# Patient Record
Sex: Female | Born: 1960 | Race: White | Hispanic: No | Marital: Married | Smoking: Never smoker
Health system: Southern US, Community
[De-identification: ages and names within clinical notes are randomized; demographics above are authoritative.]

## PROBLEM LIST (undated history)

## (undated) DIAGNOSIS — M797 Fibromyalgia: Secondary | ICD-10-CM

## (undated) DIAGNOSIS — H838X2 Other specified diseases of left inner ear: Secondary | ICD-10-CM

## (undated) DIAGNOSIS — R000 Tachycardia, unspecified: Secondary | ICD-10-CM

## (undated) DIAGNOSIS — D329 Benign neoplasm of meninges, unspecified: Secondary | ICD-10-CM

## (undated) DIAGNOSIS — F329 Major depressive disorder, single episode, unspecified: Secondary | ICD-10-CM

## (undated) DIAGNOSIS — F32A Depression, unspecified: Secondary | ICD-10-CM

## (undated) DIAGNOSIS — G43119 Migraine with aura, intractable, without status migrainosus: Secondary | ICD-10-CM

## (undated) HISTORY — DX: Benign neoplasm of meninges, unspecified: D32.9

## (undated) HISTORY — DX: Tachycardia, unspecified: R00.0

## (undated) HISTORY — DX: Other specified diseases of left inner ear: H83.8X2

## (undated) HISTORY — DX: Depression, unspecified: F32.A

## (undated) HISTORY — DX: Migraine with aura, intractable, without status migrainosus: G43.119

## (undated) HISTORY — DX: Major depressive disorder, single episode, unspecified: F32.9

## (undated) HISTORY — DX: Fibromyalgia: M79.7

---

## 2005-06-29 HISTORY — PX: ENDOMETRIAL ABLATION: SHX621

## 2008-05-29 HISTORY — PX: BRAIN MENINGIOMA EXCISION: SHX576

## 2010-09-10 ENCOUNTER — Observation Stay: Payer: Self-pay | Admitting: Internal Medicine

## 2010-12-08 ENCOUNTER — Emergency Department: Payer: Self-pay | Admitting: Emergency Medicine

## 2010-12-30 HISTORY — PX: OTHER SURGICAL HISTORY: SHX169

## 2011-01-05 ENCOUNTER — Ambulatory Visit: Payer: Self-pay | Admitting: Obstetrics and Gynecology

## 2011-08-10 ENCOUNTER — Emergency Department: Payer: Self-pay | Admitting: Unknown Physician Specialty

## 2012-02-01 ENCOUNTER — Ambulatory Visit: Payer: Self-pay

## 2012-03-01 ENCOUNTER — Ambulatory Visit: Payer: Self-pay | Admitting: Obstetrics and Gynecology

## 2013-04-25 ENCOUNTER — Ambulatory Visit: Payer: Self-pay | Admitting: Obstetrics and Gynecology

## 2013-05-02 ENCOUNTER — Ambulatory Visit: Payer: Self-pay | Admitting: Obstetrics and Gynecology

## 2013-09-20 ENCOUNTER — Ambulatory Visit: Payer: Self-pay | Admitting: Internal Medicine

## 2014-02-08 ENCOUNTER — Ambulatory Visit: Payer: Self-pay | Admitting: Internal Medicine

## 2014-06-25 ENCOUNTER — Ambulatory Visit (INDEPENDENT_AMBULATORY_CARE_PROVIDER_SITE_OTHER): Payer: BC Managed Care – PPO

## 2014-06-25 ENCOUNTER — Ambulatory Visit (INDEPENDENT_AMBULATORY_CARE_PROVIDER_SITE_OTHER): Payer: BC Managed Care – PPO | Admitting: Neurology

## 2014-06-25 ENCOUNTER — Encounter: Payer: Self-pay | Admitting: Neurology

## 2014-06-25 VITALS — BP 143/89 | HR 91 | Ht 60.0 in | Wt 158.0 lb

## 2014-06-25 DIAGNOSIS — M5412 Radiculopathy, cervical region: Secondary | ICD-10-CM

## 2014-06-25 DIAGNOSIS — R5383 Other fatigue: Secondary | ICD-10-CM

## 2014-06-25 DIAGNOSIS — R5381 Other malaise: Secondary | ICD-10-CM

## 2014-06-25 DIAGNOSIS — R531 Weakness: Secondary | ICD-10-CM

## 2014-06-25 NOTE — Progress Notes (Signed)
[facility]    Provider:  Dr Janann Colonel Referring Provider: Hayden Rasmussen, MD Primary Care Physician:  Hayden Rasmussen., MD  CC:  Left sided weakness  HPI:  Michelle Reilly is a 53 y.o. female here as a referral from Dr. Darron Doom for left sided weakness. She feels the weakness is mainly in the leg and can involve the arm too. If she gets very fatigued then it gets severe. By end of the week states she is so tired she can barely move that side. Notes some paresthesias and numbness in her LLE. Has occasional muscle twitching. Husband notes that she is affected by weather changes. Notes some gait instability. Notes some blurry vision. No noted ptosis.   Has history of fibromyalgia, diagnosed in 2009. Had meningioma resection done in 2009, was on the right side and causing "brain zaps, word finding difficulty" started then and has gotten progressively worse. Had MRI brain done in November 2014 ago which was unremarkable per the patient.   Review of Systems: Out of a complete 14 system review, the patient complains of only the following symptoms, and all other reviewed systems are negative. + fatigue, blurred vision, eye pain, headache, numbness, weakness, dizziness, sleepiness, tremor, too much sleep  History   Social History  . Marital Status: Married    Spouse Name: N/A    Number of Children: N/A  . Years of Education: N/A   Occupational History  . Not on file.   Social History Main Topics  . Smoking status: Never Smoker   . Smokeless tobacco: Not on file  . Alcohol Use: Yes  . Drug Use: No  . Sexual Activity: Yes   Other Topics Concern  . Not on file   Social History Narrative  . No narrative on file    No family history on file.  Past Medical History  Diagnosis Date  . Fibromyalgia     No past surgical history on file.  Current Outpatient Prescriptions  Medication Sig Dispense Refill  . acetaminophen (TYLENOL) 325 MG tablet Take 325 mg by mouth  as needed.      Marland Kitchen amitriptyline (ELAVIL) 10 MG tablet Take 10 mg by mouth.      . DULoxetine (CYMBALTA) 60 MG capsule Take 60 mg by mouth daily.      Marland Kitchen gabapentin (NEURONTIN) 100 MG capsule Take 100 mg by mouth.      . metoprolol succinate (TOPROL-XL) 25 MG 24 hr tablet Take 25 mg by mouth daily.      . Multiple Vitamin (MULTI-VITAMINS) TABS Take by mouth.      . traMADol (ULTRAM) 50 MG tablet Take 50 mg by mouth as needed.      . Vitamin D, Ergocalciferol, (DRISDOL) 50000 UNITS CAPS capsule Take 50,000 Units by mouth once a week.       No current facility-administered medications for this visit.    Allergies as of 06/25/2014 - Review Complete 06/25/2014  Allergen Reaction Noted  . Hydrocodone-acetaminophen Hives 06/25/2014  . Oxycodone-acetaminophen Itching 06/25/2014  . Penicillins Hives 06/25/2014  . Petrolatum-zinc oxide  06/25/2014  . Propofol Other (See Comments) 06/25/2014  . Doxycycline Rash 06/25/2014  . Erythromycin Rash 06/25/2014    Vitals: BP 143/89  Pulse 91  Ht 5' (1.524 m)  Wt 158 lb (71.668 kg)  BMI 30.86 kg/m2  LMP 06/11/2014 Last Weight:  Wt Readings from Last 1 Encounters:  06/25/14 158 lb (71.668 kg)   Last Height:   Ht Readings from Last  1 Encounters:  06/25/14 5' (1.524 m)     Physical exam: Exam: Gen: NAD, conversant Eyes: anicteric sclerae, moist conjunctivae HENT: Atraumatic, oropharynx clear Neck: Trachea midline; supple,  Lungs: CTA, no wheezing, rales, rhonic                          CV: RRR, no MRG Abdomen: Soft, non-tender;  Extremities: No peripheral edema  Skin: Normal temperature, no rash,  Psych: Appropriate affect, pleasant  Neuro: MS: AA&Ox3, appropriately interactive, normal affect   Attention: WORLD backwards  Speech: fluent w/o paraphasic error  Memory: good recent and remote recall  CN: PERRL, EOMI no nystagmus, no ptosis, sensation intact to LT V1-V3 bilat, face symmetric, no weakness, hearing grossly intact,  palate elevates symmetrically, shoulder shrug 5/5 bilat,  tongue protrudes midline, no fasiculations noted.  Motor: normal bulk and tone Strength: 5/5  In all extremities with giveaway weakness of the left upper and lower extremity  Coord: rapid alternating and point-to-point (FNF, HTS) movements intact.  Reflexes: brisk 3+ patellar bilaterally, 2+ achilles, 3+ right biceps, no clonus noted, bilat downgoing toes  Sens: LT intact in all extremities  Gait: posture, stance, stride and arm-swing normal. Tandem gait intact. Able to walk on heels and toes. Romberg absent.   Assessment:  After physical and neurologic examination, review of laboratory studies, imaging, neurophysiology testing and pre-existing records, assessment will be reviewed on the problem list.  Plan:  Treatment plan and additional workup will be reviewed under Problem List.  1)Weakness 2)Meningioma s/p resection 3)fibromyalgia  53y/o woman, s/p resection of right sided meningioma, presenting for initial evaluation of left sided weakness that fluctuates and is worse when patient is fatigued. Unilateral symptoms raises question of a central process, question if related to prior meningioma resection. Patient will fax prior MRI to our office for further review. MRI C spine reviewed and shows some degenerative changes but likely not enough to explain the presentation. Worsening of symptoms as the day goes on and with fatigue raises question of an autoimmune process, such as MG, but this would be a very atypical presentation. Diagnosis of fibromyalgia may also be playing a role. Patient noted to have very brisk bilateral patellar reflexes raising question of a thoracic/lumbar cord involvement. This could explain lower extremity weakness but would not explain LUE weakness. Will order EMG/NCS, will await review of previous records prior to ordering further testing. Will consider autoimmune workup and MRI T/L spine in the future.    Jim Like, DO  [facility] [address] [place], [place] [address]  Phone [phone] Fax [phone]

## 2014-06-25 NOTE — Procedures (Signed)
     HISTORY:  Michelle Reilly is a 53 year old patient with a prior meningioma resection from the right brain. The patient reports some fatigable weakness involving the left arm and left leg. She is being evaluated for possible neuromuscular transmission disorder.  NERVE CONDUCTION STUDIES:  A repetitive nerve stimulation study was performed today. The patient had stimulation of the left median nerve at the wrist with a pick up at the left APB muscle. The muscle was fatigued for 1 minute, and then stimulation of the median nerve was administered at 3 Hz with a train of ten stimulations. This was done at 15 seconds, 30 seconds, 1 minute, 2 minutes, 3 minutes, and 4 minutes following muscle fatigue. No evidence of a decremental response was seen. Baseline was stable.   IMPRESSION:  Repetitive nerve stimulation study involving the left median nerve was performed, and appeared to be within normal limits. No evidence of a neuromuscular transmission disorder was seen on this evaluation.  Jill Alexanders MD 06/25/2014 12:04 PM  [facility] [address] [place], [place] [address]  Phone [phone] Fax [phone]

## 2014-06-25 NOTE — Procedures (Signed)
     HISTORY:  Michelle Reilly is a 53 year old patient with a history of a meningioma resected from the right brain with left-sided weakness prior to resection. She reports some issues over the last 6 months with fatigable weakness of the left leg greater than the left arm. The patient is being evaluated for possible neuropathy or a myopathic disorder.  NERVE CONDUCTION STUDIES:  Nerve conduction studies were performed on the left upper extremity. The distal motor latencies and motor amplitudes for the median and ulnar nerves were within normal limits. The F wave latencies and nerve conduction velocities for these nerves were also normal. The sensory latencies for the median and ulnar nerves were normal.  Nerve conduction studies were performed on both lower extremities. The distal motor latencies and motor amplitudes for the peroneal and posterior tibial nerves were within normal limits. The nerve conduction velocities for these nerves were also normal. The H reflex latencies were normal. The sensory latencies for the peroneal nerves were within normal limits.   EMG STUDIES:  EMG study was performed on the left upper extremity:  The first dorsal interosseous muscle reveals 2 to 4 K units with full recruitment. No fibrillations or positive waves were noted. The abductor pollicis brevis muscle reveals 2 to 4 K units with full recruitment. No fibrillations or positive waves were noted. The extensor indicis proprius muscle reveals 1 to 3 K units with full recruitment. No fibrillations or positive waves were noted. The pronator teres muscle reveals 2 to 3 K units with full recruitment. No fibrillations or positive waves were noted. The biceps muscle reveals 1 to 2 K units with full recruitment. No fibrillations or positive waves were noted. The triceps muscle reveals 2 to 4 K units with full recruitment. No fibrillations or positive waves were noted. The anterior deltoid muscle reveals 2 to 3 K  units with full recruitment. No fibrillations or positive waves were noted. The cervical paraspinal muscles were tested at 2 levels. No abnormalities of insertional activity were seen at either level tested. There was good relaxation.  EMG study was performed on the left lower extremity:  The tibialis anterior muscle reveals 2 to 4K motor units with full recruitment. No fibrillations or positive waves were seen. The peroneus tertius muscle reveals 2 to 4K motor units with full recruitment. No fibrillations or positive waves were seen. The medial gastrocnemius muscle reveals 1 to 3K motor units with full recruitment. No fibrillations or positive waves were seen. The vastus lateralis muscle reveals 2 to 4K motor units with full recruitment. No fibrillations or positive waves were seen. The iliopsoas muscle reveals 2 to 4K motor units with full recruitment. No fibrillations or positive waves were seen. The biceps femoris muscle (long head) reveals 2 to 3K motor units with full recruitment. No fibrillations or positive waves were seen. The lumbosacral paraspinal muscles were tested at 3 levels, and revealed no abnormalities of insertional activity at all 3 levels tested. There was good relaxation.   IMPRESSION:  Nerve conduction studies done on the left upper extremity and on both lower extremities were unremarkable. No evidence of a peripheral neuropathy is seen. EMG evaluation of the left upper and left lower extremities were unremarkable, without evidence of a myopathic disorder or evidence of a cervical or a lumbosacral radiculopathy.  Jill Alexanders MD 06/25/2014 11:57 AM  [facility] [address] [place], [place] [address]  Phone [phone] Fax [phone]

## 2014-06-25 NOTE — Patient Instructions (Signed)
Overall you are doing fairly well but I do want to suggest a few things today:   Remember to drink plenty of fluid, eat healthy meals and do not skip any meals. Try to eat protein with a every meal and eat a healthy snack such as fruit or nuts in between meals. Try to keep a regular sleep-wake schedule and try to exercise daily, particularly in the form of walking, 20-30 minutes a day, if you can.   As far as diagnostic testing:  1)Please schedule an EMG/Nerve conduction study when you check out  Please have your prior records from [facility] and [facility] sent to our office for further review.  We will follow up once I get your records for review. Please call us with any interim questions, concerns, problems, updates or refill requests.   My clinical assistant and will answer any of your questions and relay your messages to me and also relay most of my messages to you.   Our phone number is [phone]. We also have an after hours call service for urgent matters and there is a physician on-call for urgent questions. For any emergencies you know to call 911 or go to the nearest emergency room

## 2014-06-27 ENCOUNTER — Encounter: Payer: BC Managed Care – PPO | Admitting: Neurology

## 2016-07-23 ENCOUNTER — Encounter: Payer: Self-pay | Admitting: Neurology

## 2016-07-23 ENCOUNTER — Ambulatory Visit (INDEPENDENT_AMBULATORY_CARE_PROVIDER_SITE_OTHER): Payer: Managed Care, Other (non HMO) | Admitting: Neurology

## 2016-07-23 ENCOUNTER — Telehealth: Payer: Self-pay

## 2016-07-23 DIAGNOSIS — G43119 Migraine with aura, intractable, without status migrainosus: Secondary | ICD-10-CM | POA: Diagnosis not present

## 2016-07-23 HISTORY — DX: Migraine with aura, intractable, without status migrainosus: G43.119

## 2016-07-23 MED ORDER — KETOROLAC TROMETHAMINE 10 MG PO TABS: 10.0000 mg | ORAL_TABLET | Freq: Four times a day (QID) | ORAL | 2 refills | Status: DC | PRN

## 2016-07-23 MED ORDER — TOPIRAMATE 25 MG PO TABS: ORAL_TABLET | ORAL | 3 refills | Status: DC

## 2016-07-23 MED ORDER — ONDANSETRON HCL 4 MG PO TABS: 4.0000 mg | ORAL_TABLET | Freq: Three times a day (TID) | ORAL | 2 refills | Status: DC | PRN

## 2016-07-23 NOTE — Progress Notes (Signed)
Reason for visit: Migraine headache  Referring physician: Dr. Prudencio Burly is a 55 y.o. female  History of present illness:  Ms. Midgette is a 55 year old right-handed white female with a history of classic migraine headache. The patient also has a history of a meningioma that was discovered in 2009. The patient indicates that her migraine headaches became more prominent around that time. The patient had surgical resection of the right parietal tumor, but she had recurrence of the tumor approximately a year ago. This was treated with gamma knife radiation. 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 The patient has had some worsening of her headaches since 2016. Within the last 2 months, the headaches have become more frequent. The patient has had only 5 or 6 days without any headache within the last month. The headaches are on the top of the head, and spread to the right temporal area and occipital area associated with photophobia, phonophobia, nausea and vomiting. The patient has visual disturbances with the headache with a white halo around the peripheral vision bilaterally and with significant blurring of vision. The patient may also have some numbness of the left arm and leg and some weakness of the left arm and leg with the headache. The headaches may last up to 36 hours. The patient is taking Maxalt for the headache without much benefit. She also takes Tylenol and Ultram. The patient has come out of work since 06/24/2016. She is on short-term disability currently. The patient indicates that bright lights, certain odors such as peppers and weather changes will bring on headache. The patient is followed by Dr. Salomon Fick from [facility] for her meningioma. She indicates that she did have a recent brain MRI done on 07/06/2016 at [facility], she was told that this study did not show recurrence of the meningioma or any acute changes. She is sent to this  office for an evaluation. The patient is on Cymbalta, the dose was increased in 2016, but the patient does not believe that her migraines worsened around this time. The patient has a prominent family history of migraine, her mother and 2 of her brothers have headache. The patient describes her headache pain is a sharp pain with a throbbing sensation as well.  Past Medical History:  Diagnosis Date  . Depression   . Fibromyalgia   . Meningioma ([place])   . 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 Tachycardia    superior Ventriculat    Past Surgical History:  Procedure Laterality Date  . BRAIN MENINGIOMA EXCISION  05/2008   Gamma knife procedure 2016  . ENDOMETRIAL ABLATION  8/06  . heart ablation  2/12    Family History  Problem Relation Age of Onset  . Diabetes Mother   . Arthritis/Rheumatoid Mother   . Addison's disease Mother   . Anemia Mother   . Metabolic syndrome Brother     Social history:  reports that she has never smoked. She does not have any smokeless tobacco history on file. She reports that she drinks alcohol. She reports that she does not use drugs.  Medications:  Prior to Admission medications   Medication Sig Start Date End Date Taking? Authorizing Provider  acetaminophen (TYLENOL) 325 MG tablet Take 325 mg by mouth as needed.    Historical Provider, MD  DULoxetine (CYMBALTA) 60 MG capsule Take 60 mg by mouth daily. 05/06/14   Historical Provider, MD  gabapentin (NEURONTIN) 100 MG  capsule Take 100 mg by mouth. 10/18/10   Historical Provider, MD  metoprolol succinate (TOPROL-XL) 25 MG 24 hr tablet Take 25 mg by mouth daily. 06/04/14   Historical Provider, MD  Multiple Vitamin (MULTI-VITAMINS) TABS Take by mouth.    Historical Provider, MD  traMADol (ULTRAM) 50 MG tablet Take 50 mg by mouth as needed. 10/07/10   Historical Provider, MD  Vitamin D, Ergocalciferol, (DRISDOL) 50000 UNITS CAPS capsule Take 50,000 Units by mouth once a week. 05/06/14   Historical  Provider, MD      Allergies  Allergen Reactions  . Other Other (See Comments)    Other reaction(s): Other Propafol (sedating agent) - causes severe congestion  . Hydrocodone-Acetaminophen Hives  . Oxycodone-Acetaminophen Itching    Other Reaction: itchy face  . Penicillins Hives  . Petrolatum-Zinc Oxide     Other reaction(s): Vomiting  . Propofol Other (See Comments)    After heart ablation, they told her she was allergic, all she remembers is congestion.  . Doxycycline Rash  . Erythromycin Rash  . Nitrofurantoin Nausea And Vomiting  . Sulfa Antibiotics Nausea And Vomiting    ROS:  Out of a complete 14 system review of symptoms, the patient complains only of the following symptoms, and all other reviewed systems are negative.  Decreased activity, decreased appetite, fatigue Light sensitivity, blurred vision Constipation Frequent infections Achy muscles, walking difficulty Dizziness, headache, weakness, tremors  Blood pressure 117/81, pulse 98, height 5\' 1"  (1.549 m), weight 139 lb 8 oz (63.3 kg), last menstrual period 06/11/2014.  Physical Exam  General: The patient is alert and cooperative at the time of the examination.  Eyes: Pupils are equal, round, and reactive to light. Discs are flat bilaterally.  Neck: The neck is supple, no carotid bruits are noted.  Respiratory: The respiratory examination is clear.  Cardiovascular: The cardiovascular examination reveals a regular rate and rhythm, no obvious murmurs or rubs are noted.  Neuromuscular: Range of movement of the cervical spine is full. No crepitus is noted in the temporal manubrial joints.  Skin: Extremities are without significant edema.  Neurologic Exam  Mental status: The patient is alert and oriented x 3 at the time of the examination. The patient has apparent normal recent and remote memory, with an apparently normal attention span and concentration ability.  Cranial nerves: Facial symmetry is  present. There is good sensation of the face to pinprick and soft touch bilaterally. The strength of the facial muscles and the muscles to head turning and shoulder shrug are normal bilaterally. Speech is well enunciated, no aphasia or dysarthria is noted. Extraocular movements are full. Visual fields are full. The tongue is midline, and the patient has symmetric elevation of the soft palate. No obvious hearing deficits are noted.  Motor: The motor testing reveals 5 over 5 strength of all 4 extremities. Good symmetric motor tone is noted throughout.  Sensory: Sensory testing is intact to pinprick, soft touch, vibration sensation, and position sense on all 4 extremities. No evidence of extinction is noted.  Coordination: Cerebellar testing reveals good finger-nose-finger and heel-to-shin bilaterally.  Gait and station: Gait is normal. Tandem gait is normal. Romberg is negative. No drift is seen.  Reflexes: Deep tendon reflexes are symmetric and normal bilaterally. Toes are downgoing bilaterally.   MRI brain 09/20/13:  IMPRESSION:   1. No recurrent or residual malignancy.  2. No acute intracranial pathology.    Assessment/Plan:  1. Classic migraine headache  2. History of right parietal meningioma  The  patient is on Cymbalta which may exacerbate migraine headaches. The patient is not clear that there is an association with this medication and her migraine. The patient takes his medication for fibromyalgia, she is on gabapentin for this same reason. She is also on verapamil. The patient will be placed on Topamax and she will be taken off of Maxalt given the stroke like symptoms with her headache. The patient will be given Toradol to take if needed, and Zofran to take for the nausea. She will follow-up in 2 months. The patient is out of work currently, she may return to work once her headaches come under better control.  Jill Alexanders MD 07/23/2016 8:28 AM  [facility] [address] [place], [place] [address]  Phone [phone] Fax [phone]

## 2016-07-23 NOTE — Telephone Encounter (Signed)
FMLA paperwork completed today. Faxed to [employer] per pt request. Copies mailed to pt and to medical records for scanning into chart.

## 2016-07-23 NOTE — Patient Instructions (Addendum)
Topamax (topiramate) is a seizure medication that has an FDA approval for seizures and for migraine headache. Potential side effects of this medication include weight loss, cognitive slowing, tingling in the fingers and toes, and carbonated drinks will taste bad. If any significant side effects are noted on this drug, please contact our office.  Migraine Headache A migraine headache is an intense, throbbing pain on one or both sides of your head. A migraine can last for 30 minutes to several hours. CAUSES  The exact cause of a migraine headache is not always known. However, a migraine may be caused when nerves in the brain become irritated and release chemicals that cause inflammation. This causes pain. Certain things may also trigger migraines, such as:  Alcohol.  Smoking.  Stress.  Menstruation.  Aged cheeses.  Foods or drinks that contain nitrates, glutamate, aspartame, or tyramine.  Lack of sleep.  Chocolate.  Caffeine.  Hunger.  Physical exertion.  Fatigue.  Medicines used to treat chest pain (nitroglycerine), birth control pills, estrogen, and some blood pressure medicines. SIGNS AND SYMPTOMS  Pain on one or both sides of your head.  Pulsating or throbbing pain.  Severe pain that prevents daily activities.  Pain that is aggravated by any physical activity.  Nausea, vomiting, or both.  Dizziness.  Pain with exposure to bright lights, loud noises, or activity.  General sensitivity to bright lights, loud noises, or smells. Before you get a migraine, you may get warning signs that a migraine is coming (aura). An aura may include:  Seeing flashing lights.  Seeing bright spots, halos, or zigzag lines.  Having tunnel vision or blurred vision.  Having feelings of numbness or tingling.  Having trouble talking.  Having muscle weakness. DIAGNOSIS  A migraine headache is often diagnosed based on:  Symptoms.  Physical exam.  A CT scan or MRI of your  head. These imaging tests cannot diagnose migraines, but they can help rule out other causes of headaches. TREATMENT Medicines may be given for pain and nausea. Medicines can also be given to help prevent recurrent migraines.  HOME CARE INSTRUCTIONS  Only take over-the-counter or prescription medicines for pain or discomfort as directed by your health care provider. The use of long-term narcotics is not recommended.  Lie down in a dark, quiet room when you have a migraine.  Keep a journal to find out what may trigger your migraine headaches. For example, write down:  What you eat and drink.  How much sleep you get.  Any change to your diet or medicines.  Limit alcohol consumption.  Quit smoking if you smoke.  Get 7-9 hours of sleep, or as recommended by your health care provider.  Limit stress.  Keep lights dim if bright lights bother you and make your migraines worse. SEEK IMMEDIATE MEDICAL CARE IF:   Your migraine becomes severe.  You have a fever.  You have a stiff neck.  You have vision loss.  You have muscular weakness or loss of muscle control.  You start losing your balance or have trouble walking.  You feel faint or pass out.  You have severe symptoms that are different from your first symptoms. MAKE SURE YOU:   Understand these instructions.  Will watch your condition.  Will get help right away if you are not doing well or get worse.   This information is not intended to replace advice given to you by your health care provider. Make sure you discuss any questions you have with your  health care provider.   Document Released: 11/15/2005 Document Revised: 12/06/2014 Document Reviewed: 07/23/2013 Elsevier Interactive Patient Education [employer].

## 2016-07-27 ENCOUNTER — Telehealth: Payer: Self-pay | Admitting: *Deleted

## 2016-07-27 DIAGNOSIS — Z0289 Encounter for other administrative examinations: Secondary | ICD-10-CM

## 2016-07-27 NOTE — Telephone Encounter (Signed)
Pt [employer] form on [employer].

## 2016-07-29 ENCOUNTER — Telehealth: Payer: Self-pay | Admitting: Neurology

## 2016-07-29 MED ORDER — ZONISAMIDE 25 MG PO CAPS: ORAL_CAPSULE | ORAL | 3 refills | Status: DC

## 2016-07-29 NOTE — Telephone Encounter (Signed)
Pt was seen last Friday d/t migraine. She also has hx of meningioma and fibromyalgia. She's on Cymbalta, gabapentin and verapamil. Was started on Topamax and taken off of Maxalt. Also given Toradol and Zofran to use as needed. She called this afternoon saying that she feels worse since meds were changed.

## 2016-07-29 NOTE — Telephone Encounter (Signed)
I called the patient. She feels poorly on the Topamax. We will stop the medication and try zonegran.

## 2016-07-29 NOTE — Telephone Encounter (Signed)
Pt called said somedays she feels like she has the flu, feels worse that when she started the medications. She doesn't know which one or all is causing the symptoms. Please call

## 2016-07-30 ENCOUNTER — Telehealth: Payer: Self-pay | Admitting: *Deleted

## 2016-07-30 NOTE — Telephone Encounter (Signed)
Pt form faxed to [facility] on 07/30/2016.

## 2016-08-16 ENCOUNTER — Telehealth: Payer: Self-pay | Admitting: Neurology

## 2016-08-16 NOTE — Telephone Encounter (Signed)
Returned call to pt. Reports that she was able to manage HA earlier today w/ Tylenol but did have to take Toradol this afternoon as pain intensified. She just increase dose of Zonegran to 75 mg on Friday. Let her know that this medication is a HA preventative and since she has only been on higher dose of med for a couple of days, it may take a week or longer for medication to help decrease the frequency of the HAS. Meanwhile, she can use prn pain meds for breakthrough HA. Agreed to call back if she is unable to manage pain this way or if she notices more frequent HAs over the next couple of weeks.

## 2016-08-16 NOTE — Telephone Encounter (Signed)
Pt called in stating she had a migraine on Friday and a wicked one on Saturday. She currently has a headache and is taking Tylenol to keep it from getting worse. Stats she is taking donisamide 75 mg. She has been at 75mg  since Friday. She wants to know what do. She is tired of feeling this way. Please call and advise [phone]

## 2016-08-31 ENCOUNTER — Emergency Department (HOSPITAL_COMMUNITY): Payer: Managed Care, Other (non HMO)

## 2016-08-31 ENCOUNTER — Encounter (HOSPITAL_COMMUNITY): Payer: Self-pay | Admitting: Emergency Medicine

## 2016-08-31 ENCOUNTER — Emergency Department (HOSPITAL_COMMUNITY)
Admission: EM | Admit: 2016-08-31 | Discharge: 2016-08-31 | Disposition: A | Discharge status: Home / Self Care | Payer: Managed Care, Other (non HMO) | Attending: Emergency Medicine | Admitting: Emergency Medicine

## 2016-08-31 DIAGNOSIS — K5900 Constipation, unspecified: Secondary | ICD-10-CM

## 2016-08-31 DIAGNOSIS — R1012 Left upper quadrant pain: Secondary | ICD-10-CM | POA: Insufficient documentation

## 2016-08-31 DIAGNOSIS — Z791 Long term (current) use of non-steroidal anti-inflammatories (NSAID): Secondary | ICD-10-CM | POA: Insufficient documentation

## 2016-08-31 DIAGNOSIS — R51 Headache: Secondary | ICD-10-CM | POA: Insufficient documentation

## 2016-08-31 DIAGNOSIS — R109 Unspecified abdominal pain: Secondary | ICD-10-CM | POA: Diagnosis present

## 2016-08-31 DIAGNOSIS — R1110 Vomiting, unspecified: Secondary | ICD-10-CM | POA: Insufficient documentation

## 2016-08-31 DIAGNOSIS — Z79899 Other long term (current) drug therapy: Secondary | ICD-10-CM | POA: Insufficient documentation

## 2016-08-31 LAB — COMPREHENSIVE METABOLIC PANEL
ALBUMIN: 4 g/dL (ref 3.5–5.0)
ALK PHOS: 90 U/L (ref 38–126)
ALT: 16 U/L (ref 14–54)
AST: 17 U/L (ref 15–41)
Anion gap: 5 (ref 5–15)
BUN: 11 mg/dL (ref 6–20)
CALCIUM: 9.4 mg/dL (ref 8.9–10.3)
CO2: 29 mmol/L (ref 22–32)
CREATININE: 0.95 mg/dL (ref 0.44–1.00)
Chloride: 107 mmol/L (ref 101–111)
GFR calc Af Amer: >60 mL/min (ref >60)
GFR calc non Af Amer: >60 mL/min (ref >60)
GLUCOSE: 88 mg/dL (ref 65–99)
Potassium: 4.1 mmol/L (ref 3.5–5.1)
SODIUM: 141 mmol/L (ref 135–145)
Total Bilirubin: 0.8 mg/dL (ref 0.3–1.2)
Total Protein: 6.9 g/dL (ref 6.5–8.1)

## 2016-08-31 LAB — LIPASE, BLOOD: Lipase: 38 U/L (ref 11–51)

## 2016-08-31 LAB — CBC
HCT: 40.3 % (ref 36.0–46.0)
Hemoglobin: 13.5 g/dL (ref 12.0–15.0)
MCH: 31.1 pg (ref 26.0–34.0)
MCHC: 33.5 g/dL (ref 30.0–36.0)
MCV: 92.9 fL (ref 78.0–100.0)
PLATELETS: 287 10*3/uL (ref 150–400)
RBC: 4.34 MIL/uL (ref 3.87–5.11)
RDW: 12.4 % (ref 11.5–15.5)
WBC: 5.6 10*3/uL (ref 4.0–10.5)

## 2016-08-31 LAB — URINALYSIS, ROUTINE W REFLEX MICROSCOPIC
BILIRUBIN URINE: NEGATIVE
Glucose, UA: NEGATIVE mg/dL
HGB URINE DIPSTICK: NEGATIVE
KETONES UR: NEGATIVE mg/dL
Leukocytes, UA: NEGATIVE
Nitrite: NEGATIVE
PROTEIN: NEGATIVE mg/dL
SPECIFIC GRAVITY, URINE: 1.015 (ref 1.005–1.030)
pH: 6.5 (ref 5.0–8.0)

## 2016-08-31 MED ORDER — SODIUM CHLORIDE 0.9 % IV BOLUS (SEPSIS)
500.0000 mL | Freq: Once | INTRAVENOUS | Status: AC
  Administered 2016-08-31: 500 mL via INTRAVENOUS

## 2016-08-31 MED ORDER — FENTANYL CITRATE (PF) 100 MCG/2ML IJ SOLN
50.0000 ug | INTRAMUSCULAR | Status: DC | PRN
  Administered 2016-08-31: 50 ug via INTRAVENOUS
  Filled 2016-08-31: qty 2

## 2016-08-31 MED ORDER — DICYCLOMINE HCL 20 MG PO TABS: 20.0000 mg | ORAL_TABLET | Freq: Three times a day (TID) | ORAL | 0 refills | Status: DC | PRN

## 2016-08-31 MED ORDER — ONDANSETRON HCL 4 MG/2ML IJ SOLN
4.0000 mg | Freq: Once | INTRAMUSCULAR | Status: AC | PRN
  Administered 2016-08-31: 4 mg via INTRAVENOUS
  Filled 2016-08-31: qty 2

## 2016-08-31 MED ORDER — ONDANSETRON 4 MG PO TBDP
4.0000 mg | ORAL_TABLET | Freq: Once | ORAL | Status: AC | PRN
  Administered 2016-08-31: 4 mg via ORAL
  Filled 2016-08-31: qty 1

## 2016-08-31 MED ORDER — HYDROMORPHONE HCL 1 MG/ML IJ SOLN
1.0000 mg | Freq: Once | INTRAMUSCULAR | Status: AC
  Administered 2016-08-31: 1 mg via INTRAVENOUS
  Filled 2016-08-31: qty 1

## 2016-08-31 MED ORDER — ONDANSETRON HCL 4 MG/2ML IJ SOLN
4.0000 mg | Freq: Once | INTRAMUSCULAR | Status: DC
  Filled 2016-08-31: qty 2

## 2016-08-31 MED ORDER — MAGNESIUM CITRATE PO SOLN: 0.5000 | Freq: Once | ORAL | 0 refills | Status: DC | PRN

## 2016-08-31 MED ORDER — ONDANSETRON HCL 4 MG/2ML IJ SOLN: 4.0000 mg | Freq: Once | INTRAMUSCULAR | Status: DC | PRN

## 2016-08-31 MED ORDER — IOPAMIDOL (ISOVUE-300) INJECTION 61%
100.0000 mL | Freq: Once | INTRAVENOUS | Status: AC | PRN
  Administered 2016-08-31: 100 mL via INTRAVENOUS

## 2016-08-31 MED ORDER — IOPAMIDOL (ISOVUE-300) INJECTION 61%
30.0000 mL | Freq: Once | INTRAVENOUS | Status: DC | PRN
  Administered 2016-08-31: 30 mL via ORAL
  Filled 2016-08-31: qty 30

## 2016-08-31 NOTE — ED Triage Notes (Signed)
Pt states that she was seen earlier and had a CT scan of her abdomen that only showed constipation. Tried laxatives at home but was she has been vomiting since. Migraine. Alert and oriented.

## 2016-08-31 NOTE — ED Provider Notes (Signed)
[place] DEPT Provider Note   CSN: [id number] Arrival date & time: 08/31/16  [id number]     History   Chief Complaint Chief Complaint  Patient presents with  . Flank Pain  . Abdominal Pain    HPI Michelle Reilly is a 55 y.o. female.  HPI   Patient with hx diverticulitis, colon polyps presents with left sided abdominal pain, left lower back pain, constipation.  Constipation has been ongoing x weeks, no significant relief with bisocdyl, miralax, Linzest.  Associated nausea without vomiting.  She is able to pass gas.  No urinary symptoms.  Has had chills but Tmax of 98.4.  Last colonoscopy was 4.5 years ago, she is supposed to get them Q3 yrs.  No local GI, referral in process.  PCP Precious Haws.   Past Medical History:  Diagnosis Date  . Classical migraine with intractable migraine 07/23/2016  . Depression   . Fibromyalgia   . Meningioma ([place])   . 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 Tachycardia    superior Ventriculat    Patient Active Problem List   Diagnosis Date Noted  . Classical migraine with intractable migraine 07/23/2016  . Weakness 06/25/2014    Past Surgical History:  Procedure Laterality Date  . BRAIN MENINGIOMA EXCISION  05/2008   Gamma knife procedure 2016  . ENDOMETRIAL ABLATION  8/06  . heart ablation  2/12    OB History    No data available       Home Medications    Prior to Admission medications   Medication Sig Start Date End Date Taking? Authorizing Provider  acetaminophen (TYLENOL) 500 MG tablet Take 1,000 mg by mouth every 6 (six) hours as needed for mild pain or headache.    Yes Historical Provider, MD  buPROPion (WELLBUTRIN XL) 150 MG 24 hr tablet one tablet daily in the morning 05/11/15  Yes Historical Provider, MD  DULoxetine (CYMBALTA) 60 MG capsule Take 120 mg by mouth at bedtime.  05/06/14  Yes Historical Provider, MD  gabapentin (NEURONTIN) 300 MG capsule Take 300 mg by mouth at bedtime.  10/18/10  Yes Historical  Provider, MD  ketorolac (TORADOL) 10 MG tablet Take 1 tablet (10 mg total) by mouth every 6 (six) hours as needed. Patient taking differently: Take 10 mg by mouth every 6 (six) hours as needed for moderate pain.  07/23/16  Yes Kathrynn Ducking, MD  linaclotide [facility]) 72 MCG capsule Take 144 mcg by mouth daily before breakfast.   Yes Historical Provider, MD  ondansetron (ZOFRAN) 4 MG tablet Take 1 tablet (4 mg total) by mouth every 8 (eight) hours as needed for nausea or vomiting. 07/23/16  Yes Kathrynn Ducking, MD  traMADol (ULTRAM) 50 MG tablet Take 100 mg by mouth as needed.  10/07/10  Yes Historical Provider, MD  verapamil (VERELAN) 100 MG 24 hr capsule Take 200 mg by mouth at bedtime.  02/27/16 02/21/17 Yes Historical Provider, MD  Vitamin D, Ergocalciferol, (DRISDOL) 50000 UNITS CAPS capsule Take 50,000 Units by mouth once a week. 05/06/14  Yes Historical Provider, MD  zonisamide (ZONEGRAN) 25 MG capsule Take one capsule at night for one week, then take 2 capsules at night for one week, then take 3 capsules at night Patient taking differently: Take 75 mg by mouth at bedtime.  07/29/16  Yes Kathrynn Ducking, MD  dicyclomine (BENTYL) 20 MG tablet Take 1 tablet (20 mg total) by mouth 3 (three) times daily as needed for spasms (abdominal  cramping/pain). 08/31/16   Clayton Bibles, PA-C  magnesium citrate SOLN Take 148 mLs (0.5 Bottles total) by mouth once as needed for severe constipation. 08/31/16   Clayton Bibles, PA-C    Family History Family History  Problem Relation Age of Onset  . Diabetes Mother   . Arthritis/Rheumatoid Mother   . Addison's disease Mother   . Anemia Mother   . Migraines Mother   . Cancer - Lung Father   . Metabolic syndrome Brother   . Migraines Brother   . Migraines Brother     Social History Social History  Substance Use Topics  . Smoking status: Never Smoker  . Smokeless tobacco: Never Used  . Alcohol use Yes     Allergies   Other; Hydrocodone-acetaminophen;  Oxycodone-acetaminophen; Penicillins; Petrolatum-zinc oxide; Propofol; Topamax [topiramate]; Doxycycline; Erythromycin; Nitrofurantoin; and Sulfa antibiotics   Review of Systems Review of Systems  All other systems reviewed and are negative.    Physical Exam Updated Vital Signs BP 121/75 (BP Location: Right Arm)   Pulse 74   Temp 97.8 F (36.6 C)   Resp 20   LMP 06/11/2014   SpO2 100%   Physical Exam  Constitutional: She appears well-developed and well-nourished. No distress.  HENT:  Head: Normocephalic and atraumatic.  Neck: Neck supple.  Cardiovascular: Normal rate and regular rhythm.   Pulmonary/Chest: Effort normal and breath sounds normal. No respiratory distress. She has no wheezes. She has no rales.  Abdominal: Soft. Bowel sounds are normal. She exhibits no distension. There is tenderness in the left upper quadrant and left lower quadrant. There is CVA tenderness (left). There is no rebound and no guarding.  Genitourinary: Rectum normal. Rectal exam shows no mass, no tenderness and anal tone normal.  Genitourinary Comments: No stool impaction.   Neurological: She is alert.  Skin: She is not diaphoretic.  Nursing note and vitals reviewed.    ED Treatments / Results  Labs (all labs ordered are listed, but only abnormal results are displayed) Labs Reviewed  LIPASE, BLOOD  COMPREHENSIVE METABOLIC PANEL  CBC  URINALYSIS, ROUTINE W REFLEX MICROSCOPIC (NOT AT [facility])    EKG  EKG Interpretation None       Radiology Ct Abdomen Pelvis W Contrast  Result Date: 08/31/2016 CLINICAL DATA:  Constipation and progressive left flank pain. EXAM: CT ABDOMEN AND PELVIS WITH CONTRAST TECHNIQUE: Multidetector CT imaging of the abdomen and pelvis was performed using the standard protocol following bolus administration of intravenous contrast. CONTRAST:  164mL ISOVUE-300 IOPAMIDOL (ISOVUE-300) INJECTION 61% COMPARISON:  None. FINDINGS: Lower chest: Unremarkable Hepatobiliary:  Unremarkable Pancreas: Unremarkable Spleen: Unremarkable Adrenals/Urinary Tract: 2-3 mm right kidney lower pole nonobstructive calculus. 2 mm left mid kidney nonobstructive calculus. No hydronephrosis or hydroureter. No ureteral or bladder calculus identified. Stomach/Bowel: Prominent stool throughout the colon favors constipation. Appendix normal. No dilated small bowel. The prominence of stool affects primarily the proximal 75% of the colon. Vascular/Lymphatic: Unremarkable Reproductive: Suspected small anterior uterine body intramural fibroid. Other: No supplemental non-categorized findings. Musculoskeletal: Lumbar spondylosis and degenerative disc disease at L5-S1 without impingement. Small umbilical hernia contains adipose tissue. IMPRESSION: 1.  Prominent stool throughout the colon favors constipation. 2. Bilateral nonobstructive nephrolithiasis. 3. Small anterior uterine body fibroid. 4. Lumbar spondylosis and degenerative disc disease at L5-S1, without impingement. Electronically Signed   By: Van Clines M.D.   On: 08/31/2016 09:39    Procedures Procedures (including critical care time)  Medications Ordered in ED Medications  ondansetron (ZOFRAN) injection 4 mg (4 mg Intravenous Not  Given 08/31/16 0849)  iopamidol (ISOVUE-300) 61 % injection 30 mL (30 mLs Oral Contrast Given 08/31/16 0826)  ondansetron (ZOFRAN) injection 4 mg (4 mg Intravenous Given 08/31/16 0753)  sodium chloride 0.9 % bolus 500 mL (0 mLs Intravenous Stopped 08/31/16 0948)  HYDROmorphone (DILAUDID) injection 1 mg (1 mg Intravenous Given 08/31/16 0845)  iopamidol (ISOVUE-300) 61 % injection 100 mL (100 mLs Intravenous Contrast Given 08/31/16 0922)     Initial Impression / Assessment and Plan / ED Course  I have reviewed the triage vital signs and the nursing notes.  Pertinent labs & imaging results that were available during my care of the patient were reviewed by me and considered in my medical decision making (see  chart for details).  Clinical Course    Afebrile nontoxic patient with severe constipation and left sided abdominal pain.  Labs reassuring.  UA does not appear infected.  CT abd/pelvis demonstrates constipation, incidental finding of small uterine fibroid.  D/C home with symptomatic treatment for constipation and abdominal cramping.  Pt will have GI follow up soon, referral in process through PCP.  Discussed result, findings, treatment, and follow up  with patient.  Pt given return precautions.  Pt verbalizes understanding and agrees with plan.      Final Clinical Impressions(s) / ED Diagnoses   Final diagnoses:  Constipation, unspecified constipation type  Abdominal pain, unspecified abdominal location    New Prescriptions Discharge Medication List as of 08/31/2016 11:50 AM    START taking these medications   Details  dicyclomine (BENTYL) 20 MG tablet Take 1 tablet (20 mg total) by mouth 3 (three) times daily as needed for spasms (abdominal cramping/pain)., Starting Tue 08/31/2016, Print    magnesium citrate SOLN Take 148 mLs (0.5 Bottles total) by mouth once as needed for severe constipation., Starting Tue 08/31/2016, Print         [place], PA-C 08/31/16 [place], MD 08/31/16 [phone]

## 2016-08-31 NOTE — ED Triage Notes (Signed)
Pt c/o ongoing constipation x several weeks and progressive flank pain onset Friday, started Miralax last week, no relief, started Linzest on Friday, Linzest with suppository on Saturday. Associated symptoms: nausea. No urinary symptoms.

## 2016-08-31 NOTE — Discharge Instructions (Signed)
Read the information below.  Use the prescribed medication as directed.  Please discuss all new medications with your pharmacist.  You may return to the Emergency Department at any time for worsening condition or any new symptoms that concern you.   If you develop high fevers, worsening abdominal pain, uncontrolled vomiting, or are unable to tolerate fluids by mouth, return to the ER for a recheck.  ° °

## 2016-09-01 ENCOUNTER — Emergency Department (HOSPITAL_COMMUNITY)
Admission: EM | Admit: 2016-09-01 | Discharge: 2016-09-01 | Disposition: A | Discharge status: Home / Self Care | Payer: Managed Care, Other (non HMO) | Source: Home / Self Care | Attending: Emergency Medicine | Admitting: Emergency Medicine

## 2016-09-01 DIAGNOSIS — K5900 Constipation, unspecified: Secondary | ICD-10-CM

## 2016-09-01 MED ORDER — ONDANSETRON 4 MG PO TBDP
4.0000 mg | ORAL_TABLET | Freq: Once | ORAL | Status: AC
  Administered 2016-09-01: 4 mg via ORAL
  Filled 2016-09-01: qty 1

## 2016-09-01 MED ORDER — MILK AND MOLASSES ENEMA
1.0000 | Freq: Once | RECTAL | Status: AC
  Administered 2016-09-01: 250 mL via RECTAL
  Filled 2016-09-01: qty 250

## 2016-09-01 NOTE — ED Provider Notes (Signed)
[place] DEPT Provider Note   CSN: [id number] Arrival date & time: 08/31/16  2056  By signing my name below, I, Gwenlyn Fudge, attest that this documentation has been prepared under the direction and in the presence of Junius Creamer, NP. Electronically Signed: Gwenlyn Fudge, ED Scribe. 09/01/16. 1:00 AM.  History   Chief Complaint Chief Complaint  Patient presents with  . Emesis   The history is provided by the patient. No language interpreter was used.    HPI Comments: Michelle Reilly is a 55 y.o. female Chronic Constipation who presents to the Emergency Department complaining of gradual onset, episodic vomiting onset earlier today. Pt was seen in ED earlier and had a CT scan of her abdomen which showed constipation. Pt has taken laxatives and stool softeners at home with no relief to symptoms. After being sent home, pt began to experience episodes of vomiting. Pt reports associated migraines and LUQ abdominal pain.  Past Medical History:  Diagnosis Date  . Classical migraine with intractable migraine 07/23/2016  . Depression   . Fibromyalgia   . Meningioma ([place])   . 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 Tachycardia    superior Ventriculat    Patient Active Problem List   Diagnosis Date Noted  . Classical migraine with intractable migraine 07/23/2016  . Weakness 06/25/2014    Past Surgical History:  Procedure Laterality Date  . BRAIN MENINGIOMA EXCISION  05/2008   Gamma knife procedure 2016  . ENDOMETRIAL ABLATION  8/06  . heart ablation  2/12    OB History    No data available       Home Medications    Prior to Admission medications   Medication Sig Start Date End Date Taking? Authorizing Provider  acetaminophen (TYLENOL) 500 MG tablet Take 1,000 mg by mouth every 6 (six) hours as needed for mild pain or headache.    Yes Historical Provider, MD  buPROPion (WELLBUTRIN XL) 150 MG 24 hr tablet one tablet daily in the morning 05/11/15  Yes  Historical Provider, MD  DULoxetine (CYMBALTA) 60 MG capsule Take 120 mg by mouth at bedtime.  05/06/14  Yes Historical Provider, MD  gabapentin (NEURONTIN) 300 MG capsule Take 300 mg by mouth at bedtime.  10/18/10  Yes Historical Provider, MD  ketorolac (TORADOL) 10 MG tablet Take 1 tablet (10 mg total) by mouth every 6 (six) hours as needed. Patient taking differently: Take 10 mg by mouth every 6 (six) hours as needed for moderate pain.  07/23/16  Yes Kathrynn Ducking, MD  linaclotide [facility]) 72 MCG capsule Take 144 mcg by mouth daily before breakfast.   Yes Historical Provider, MD  magnesium citrate SOLN Take 148 mLs (0.5 Bottles total) by mouth once as needed for severe constipation. 08/31/16  Yes Clayton Bibles, PA-C  ondansetron (ZOFRAN) 4 MG tablet Take 1 tablet (4 mg total) by mouth every 8 (eight) hours as needed for nausea or vomiting. 07/23/16  Yes Kathrynn Ducking, MD  traMADol (ULTRAM) 50 MG tablet Take 100 mg by mouth as needed for moderate pain.  10/07/10  Yes Historical Provider, MD  verapamil (VERELAN) 100 MG 24 hr capsule Take 200 mg by mouth at bedtime.  02/27/16 02/21/17 Yes Historical Provider, MD  Vitamin D, Ergocalciferol, (DRISDOL) 50000 UNITS CAPS capsule Take 50,000 Units by mouth once a week. 05/06/14  Yes Historical Provider, MD  zonisamide (ZONEGRAN) 25 MG capsule Take one capsule at night for one week, then take 2 capsules at  night for one week, then take 3 capsules at night Patient taking differently: Take 75 mg by mouth at bedtime.  07/29/16  Yes Kathrynn Ducking, MD  dicyclomine (BENTYL) 20 MG tablet Take 1 tablet (20 mg total) by mouth 3 (three) times daily as needed for spasms (abdominal cramping/pain). 08/31/16   Clayton Bibles, PA-C    Family History Family History  Problem Relation Age of Onset  . Diabetes Mother   . Arthritis/Rheumatoid Mother   . Addison's disease Mother   . Anemia Mother   . Migraines Mother   . Cancer - Lung Father   . Metabolic syndrome Brother   .  Migraines Brother   . Migraines Brother     Social History Social History  Substance Use Topics  . Smoking status: Never Smoker  . Smokeless tobacco: Never Used  . Alcohol use Yes     Allergies   Other; Hydrocodone-acetaminophen; Oxycodone-acetaminophen; Penicillins; Petrolatum-zinc oxide; Propofol; Topamax [topiramate]; Doxycycline; Erythromycin; Nitrofurantoin; and Sulfa antibiotics   Review of Systems Review of Systems  Constitutional: Negative for chills and fever.  Respiratory: Negative for shortness of breath.   Cardiovascular: Negative for chest pain.  Gastrointestinal: Positive for abdominal pain and vomiting.  Genitourinary: Negative for dysuria and flank pain.  Neurological: Positive for headaches.  All other systems reviewed and are negative.  Physical Exam Updated Vital Signs BP 141/80 (BP Location: Left Arm)   Pulse 76   Temp 98.4 F (36.9 C) (Oral)   Resp 18   LMP 06/11/2014   SpO2 95%   Physical Exam  Constitutional: She appears well-developed and well-nourished. She is active. No distress.  HENT:  Head: Normocephalic and atraumatic.  Eyes: Conjunctivae are normal.  Cardiovascular: Normal rate.   Pulmonary/Chest: Effort normal. No respiratory distress.  Abdominal: She exhibits no distension. Bowel sounds are decreased. There is tenderness.    Neurological: She is alert.  Skin: Skin is warm and dry.  Psychiatric: She has a normal mood and affect. Her behavior is normal.  Nursing note and vitals reviewed.  ED Treatments / Results  DIAGNOSTIC STUDIES: Oxygen Saturation is 95% on RA, adequate by my interpretation.    COORDINATION OF CARE: 12:50 AM Discussed treatment plan with pt at bedside which includes Milk and Molasses Enema and pt agreed to plan.  Labs (all labs ordered are listed, but only abnormal results are displayed) Labs Reviewed - No data to display  EKG  EKG Interpretation None       Radiology Ct Abdomen Pelvis W  Contrast  Result Date: 08/31/2016 CLINICAL DATA:  Constipation and progressive left flank pain. EXAM: CT ABDOMEN AND PELVIS WITH CONTRAST TECHNIQUE: Multidetector CT imaging of the abdomen and pelvis was performed using the standard protocol following bolus administration of intravenous contrast. CONTRAST:  123mL ISOVUE-300 IOPAMIDOL (ISOVUE-300) INJECTION 61% COMPARISON:  None. FINDINGS: Lower chest: Unremarkable Hepatobiliary: Unremarkable Pancreas: Unremarkable Spleen: Unremarkable Adrenals/Urinary Tract: 2-3 mm right kidney lower pole nonobstructive calculus. 2 mm left mid kidney nonobstructive calculus. No hydronephrosis or hydroureter. No ureteral or bladder calculus identified. Stomach/Bowel: Prominent stool throughout the colon favors constipation. Appendix normal. No dilated small bowel. The prominence of stool affects primarily the proximal 75% of the colon. Vascular/Lymphatic: Unremarkable Reproductive: Suspected small anterior uterine body intramural fibroid. Other: No supplemental non-categorized findings. Musculoskeletal: Lumbar spondylosis and degenerative disc disease at L5-S1 without impingement. Small umbilical hernia contains adipose tissue. IMPRESSION: 1.  Prominent stool throughout the colon favors constipation. 2. Bilateral nonobstructive nephrolithiasis. 3. Small anterior uterine body  fibroid. 4. Lumbar spondylosis and degenerative disc disease at L5-S1, without impingement. Electronically Signed   By: Van Clines M.D.   On: 08/31/2016 09:39    Procedures Procedures (including critical care time)  Medications Ordered in ED Medications  fentaNYL (SUBLIMAZE) injection 50 mcg (50 mcg Intravenous Given 08/31/16 2118)  ondansetron (ZOFRAN-ODT) disintegrating tablet 4 mg (4 mg Oral Given 08/31/16 2118)  milk and molasses enema (250 mLs Rectal Given 09/01/16 0212)  ondansetron (ZOFRAN-ODT) disintegrating tablet 4 mg (4 mg Oral Given 09/01/16 0137)     Initial Impression /  Assessment and Plan / ED Course  I have reviewed the triage vital signs and the nursing notes.  Pertinent labs & imaging results that were available during my care of the patient were reviewed by me and considered in my medical decision making (see chart for details).  Clinical Course   I personally performed the services described in this documentation, which was scribed in my presence. The recorded information has been reviewed and is accurate. Since I have th eCt Scan and labs from several hours ago will not repeat but have requested a Milk and Magnesia enema  Patient had large BM after enema and feels much better   Final Clinical Impressions(s) / ED Diagnoses   Final diagnoses:  Constipation, unspecified constipation type    New Prescriptions Discharge Medication List as of 09/01/2016  3:17 AM       Junius Creamer, NP 09/01/16 0129    Junius Creamer, NP 09/01/16 [id number]    Daleen Bo, MD 09/01/16 1956

## 2016-09-01 NOTE — ED Notes (Signed)
Large amount of soft stool expelled.  Pt stated "I think there is more but I'm ready to go home."    Baker Janus, NP informed of enema results.

## 2016-09-01 NOTE — ED Notes (Signed)
Pt positioned on left side. Enema was administered. Pt tolerated well.

## 2016-09-01 NOTE — Discharge Instructions (Signed)
I would recommend the regular use of Miralax  to start 1 cap full 2 times daily with a full glass of water when regular BM scale back to 1 cap full daily

## 2016-09-22 ENCOUNTER — Ambulatory Visit (INDEPENDENT_AMBULATORY_CARE_PROVIDER_SITE_OTHER): Payer: Managed Care, Other (non HMO) | Admitting: Adult Health

## 2016-09-22 ENCOUNTER — Encounter: Payer: Self-pay | Admitting: Adult Health

## 2016-09-22 VITALS — BP 136/84 | HR 76 | Resp 14 | Ht 61.0 in | Wt 141.0 lb

## 2016-09-22 DIAGNOSIS — G43009 Migraine without aura, not intractable, without status migrainosus: Secondary | ICD-10-CM

## 2016-09-22 DIAGNOSIS — K5900 Constipation, unspecified: Secondary | ICD-10-CM | POA: Diagnosis not present

## 2016-09-22 NOTE — Patient Instructions (Signed)
Continue zonegran  Follow-up with GI If your symptoms worsen or you develop new symptoms please let us know.

## 2016-09-22 NOTE — Progress Notes (Signed)
I have read the note, and I agree with the clinical assessment and plan.  Taimi Towe KEITH   

## 2016-09-22 NOTE — Progress Notes (Signed)
PATIENT: Michelle Reilly DOB: [birth year]  REASON FOR VISIT: follow up- migraine headaches, meningioma HISTORY FROM: patient  HISTORY OF PRESENT ILLNESS: Michelle Reilly is a 55 year old female with a history of migraine headaches and meningioma of the brain. She returns today for follow-up. She was placed on Topamax however she could not tolerate this medication. She was started on Zonegran. She reports that her headaches have improved in frequency and severity. In the month of October she had 3 headaches however each headache can last 2-3 days. Her headaches typically occur on the right side of her head and spread throughout. She does confirm photophobia, phonophobia, nausea and vomiting. Denies any visual disturbance. She does note with severe headache she does have left-sided weakness. She is able to move but occasionally will need help from her husband. She also notes that in September she started to have constipation. Her primary care has tried several medications. She did have 1 episode of urinary and bowel incontinence. She is being referred to GI. She states the constipation did occur around the same time she started Zonegran. However she does not want to stop this medication as she feels it is been very beneficial for her headaches and she would rather have constipation. She returns today for an evaluation.   HISTORY 07/23/16:Michelle Reilly is a 55 year old right-handed white female with a history of classic migraine headache. The patient also has a history of a meningioma that was discovered in 2009. The patient indicates that her migraine headaches became more prominent around that time. The patient had surgical resection of the right parietal tumor, but she had recurrence of the tumor approximately a year ago. This was treated with gamma knife radiation. 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 The patient has had some worsening of her headaches since  2016. Within the last 2 months, the headaches have become more frequent. The patient has had only 5 or 6 days without any headache within the last month. The headaches are on the top of the head, and spread to the right temporal area and occipital area associated with photophobia, phonophobia, nausea and vomiting. The patient has visual disturbances with the headache with a white halo around the peripheral vision bilaterally and with significant blurring of vision. The patient may also have some numbness of the left arm and leg and some weakness of the left arm and leg with the headache. The headaches may last up to 36 hours. The patient is taking Maxalt for the headache without much benefit. She also takes Tylenol and Ultram. The patient has come out of work since 06/24/2016. She is on short-term disability currently. The patient indicates that bright lights, certain odors such as peppers and weather changes will bring on headache. The patient is followed by Dr. Salomon Fick from [facility] for her meningioma. She indicates that she did have a recent brain MRI done on 07/06/2016 at [place], she was told that this study did not show recurrence of the meningioma or any acute changes. She is sent to this office for an evaluation. The patient is on Cymbalta, the dose was increased in 2016, but the patient does not believe that her migraines worsened around this time. The patient has a prominent family history of migraine, her mother and 2 of her brothers have headache. The patient describes her headache pain is a sharp pain with a throbbing sensation as well.  REVIEW OF SYSTEMS: Out of a complete 14 system review  of symptoms, the patient complains only of the following symptoms, and all other reviewed systems are negative.  Abdominal pain, swollen abdomen, constipation, incontinence of bowels, insomnia, frequent waking, daytime sleepiness, aching muscles, walking difficulty, incontinence of bladder, memory loss,  dizziness, headache, numbness, weakness, excessive thirst, light sensitivity  ALLERGIES: Allergies  Allergen Reactions  . Other Other (See Comments)    Other reaction(s): Other Propafol (sedating agent) - causes severe congestion  . Hydrocodone-Acetaminophen Hives  . Oxycodone-Acetaminophen Itching    Other Reaction: itchy face  . Penicillins Hives    .Marland KitchenHas patient had a PCN reaction causing immediate rash, facial/tongue/throat swelling, SOB or lightheadedness with hypotension: No Has patient had a PCN reaction causing severe rash involving mucus membranes or skin necrosis: No Has patient had a PCN reaction that required hospitalization No Has patient had a PCN reaction occurring within the last 10 years: No If all of the above answers are "NO", then may proceed with Cephalosporin use.   Marland Kitchen Petrolatum-Zinc Oxide Other (See Comments)    Other reaction(s): Vomiting  . Propofol Other (See Comments)    After heart ablation, they told her she was allergic, all she remembers is congestion.  . Topamax [Topiramate] Other (See Comments)    Flu like symptoms  . Doxycycline Rash  . Erythromycin Rash  . Nitrofurantoin Nausea And Vomiting  . Sulfa Antibiotics Nausea And Vomiting    HOME MEDICATIONS: Outpatient Medications Prior to Visit  Medication Sig Dispense Refill  . acetaminophen (TYLENOL) 500 MG tablet Take 1,000 mg by mouth every 6 (six) hours as needed for mild pain or headache.     Marland Kitchen buPROPion (WELLBUTRIN XL) 150 MG 24 hr tablet one tablet daily in the morning    . dicyclomine (BENTYL) 20 MG tablet Take 1 tablet (20 mg total) by mouth 3 (three) times daily as needed for spasms (abdominal cramping/pain). 20 tablet 0  . DULoxetine (CYMBALTA) 60 MG capsule Take 120 mg by mouth at bedtime.     . gabapentin (NEURONTIN) 300 MG capsule Take 300 mg by mouth at bedtime.     Marland Kitchen ketorolac (TORADOL) 10 MG tablet Take 1 tablet (10 mg total) by mouth every 6 (six) hours as needed. (Patient taking  differently: Take 10 mg by mouth every 6 (six) hours as needed for moderate pain. ) 30 tablet 2  . linaclotide (LINZESS) 72 MCG capsule Take 144 mcg by mouth daily before breakfast.    . magnesium citrate SOLN Take 148 mLs (0.5 Bottles total) by mouth once as needed for severe constipation. 296 mL 0  . ondansetron (ZOFRAN) 4 MG tablet Take 1 tablet (4 mg total) by mouth every 8 (eight) hours as needed for nausea or vomiting. 30 tablet 2  . traMADol (ULTRAM) 50 MG tablet Take 100 mg by mouth as needed for moderate pain.     . verapamil (VERELAN) 100 MG 24 hr capsule Take 200 mg by mouth at bedtime.     . Vitamin D, Ergocalciferol, (DRISDOL) 50000 UNITS CAPS capsule Take 50,000 Units by mouth once a week.    . zonisamide (ZONEGRAN) 25 MG capsule Take one capsule at night for one week, then take 2 capsules at night for one week, then take 3 capsules at night (Patient taking differently: Take 75 mg by mouth at bedtime. ) 90 capsule 3   No facility-administered medications prior to visit.     PAST MEDICAL HISTORY: Past Medical History:  Diagnosis Date  . Classical migraine with intractable migraine 07/23/2016  .  Depression   . Fibromyalgia   . Meningioma ([place])   . 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 Tachycardia    superior Ventriculat    PAST SURGICAL HISTORY: Past Surgical History:  Procedure Laterality Date  . BRAIN MENINGIOMA EXCISION  05/2008   Gamma knife procedure 2016  . ENDOMETRIAL ABLATION  8/06  . heart ablation  2/12    FAMILY HISTORY: Family History  Problem Relation Age of Onset  . Diabetes Mother   . Arthritis/Rheumatoid Mother   . Addison's disease Mother   . Anemia Mother   . Migraines Mother   . Cancer - Lung Father   . Metabolic syndrome Brother   . Migraines Brother   . Migraines Brother     SOCIAL HISTORY: Social History   Social History  . Marital status: Married    Spouse name: N/A  . Number of children: 2  . Years of education:  college   Occupational History  . [employer]    Social History Main Topics  . Smoking status: Never Smoker  . Smokeless tobacco: Never Used  . Alcohol use Yes  . Drug use: No  . Sexual activity: Yes   Other Topics Concern  . Not on file   Social History Narrative  . No narrative on file      PHYSICAL EXAM  Vitals:   09/22/16 0824  BP: 136/84  Pulse: 76  Resp: 14  Weight: 141 lb (64 kg)  Height: 5\' 1"  (1.549 m)   Body mass index is 26.64 kg/m.  Generalized: Well developed, in no acute distress   Neurological examination  Mentation: Alert oriented to time, place, history taking. Follows all commands speech and language fluent Cranial nerve II-XII: Pupils were equal round reactive to light. Extraocular movements were full, visual field were full on confrontational test. Facial sensation and strength were normal. Uvula tongue midline. Head turning and shoulder shrug  were normal and symmetric. Motor: The motor testing reveals 5 over 5 strength of all 4 extremities. Good symmetric motor tone is noted throughout.  Sensory: Sensory testing is intact to soft touch on all 4 extremities. No evidence of extinction is noted.  Coordination: Cerebellar testing reveals good finger-nose-finger and heel-to-shin bilaterally.  Gait and station: Gait is normal. Tandem gait is normal. Romberg is negative. No drift is seen.  Reflexes: Deep tendon reflexes are symmetric and normal bilaterally.   DIAGNOSTIC DATA (LABS, IMAGING, TESTING) - I reviewed patient records, labs, notes, testing and imaging myself where available.  Lab Results  Component Value Date   WBC 5.6 08/31/2016   HGB 13.5 08/31/2016   HCT 40.3 08/31/2016   MCV 92.9 08/31/2016   PLT 287 08/31/2016      Component Value Date/Time   NA 141 08/31/2016 0755   K 4.1 08/31/2016 0755   CL 107 08/31/2016 0755   CO2 29 08/31/2016 0755   GLUCOSE 88 08/31/2016 0755   BUN 11 08/31/2016 0755   CREATININE 0.95 08/31/2016 0755    CALCIUM 9.4 08/31/2016 0755   PROT 6.9 08/31/2016 0755   ALBUMIN 4.0 08/31/2016 0755   AST 17 08/31/2016 0755   ALT 16 08/31/2016 0755   ALKPHOS 90 08/31/2016 0755   BILITOT 0.8 08/31/2016 0755   GFRNONAA >60 08/31/2016 0755   GFRAA >60 08/31/2016 0755      ASSESSMENT AND PLAN 55 y.o. 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 here with:  1. Migraine headache 2. Constipation  The patient will continue on Zonegran 75 mg at bedtime. I have advised the patient that if we increase the medication she may get better benefit. However she is having a new symptom of constipation. Unsure if this is due to [place]. She plans to follow-up with GI. We will wait until she is evaluated by GI for increasing Zonegran. Patient is amenable to this plan. She will follow-up in 3-4 months or sooner if needed.  I completed paperwork for the patient allowing her to return to work 09/23/16. I did put on the paperwork that she may have 2-3 episodes a month that would require her to be out of work for 1- 2 days.     Ward Givens, MSN, NP-C 09/22/2016, 8:18 AM [facility] [address] [place], [place] [address] [phone]

## 2016-10-12 ENCOUNTER — Telehealth: Payer: Self-pay | Admitting: Neurology

## 2016-10-12 MED ORDER — DEXAMETHASONE 2 MG PO TABS: ORAL_TABLET | ORAL | 0 refills | Status: DC

## 2016-10-12 MED ORDER — ZONISAMIDE 100 MG PO CAPS: 100.0000 mg | ORAL_CAPSULE | Freq: Every day | ORAL | 3 refills | Status: DC

## 2016-10-12 NOTE — Telephone Encounter (Signed)
Pt w/ h/o migraine and meningioma was last seen by [facility] NP 09/22/16. Pt was continued on Zonegran 75 mg at bedtime as well as toradol for pain and ondansetron for nausea. Zonegran was not increased d/t pt c/o constipation. She previously couldn't tolerate Topamax.   Called and spoke to pt. Reports that she's had 3 migraines since last visit. Most recent HA started on Sunday and she's missed work yesterday and today due to pain. Says that toradol doesn't completely resolve but lowers migraine symptoms. However, pain again increases when medication wears off after 5-6 hrs. Says that she did have a good bowel movement on Friday and does not have current issues w/ constipation. She has an appt scheduled w/ GI next week.

## 2016-10-12 NOTE — Telephone Encounter (Signed)
I called patient. She has had a headache over the last 2 days. I will call in some Decadron to take over the next 3 days, we will increase his Zonegran to 100 mg at night.

## 2016-10-12 NOTE — Telephone Encounter (Signed)
Patient called requesting to leave message for Dr. Tobey Grim nurse, migraine since Sunday, states traMADol (ULTRAM) 50 MG tablet and ketorolac (TORADOL) 10 MG tablet are just keeping it at [place], asks "up medication or what", patient adds, NP, Jinny Blossom advised her if the pain kept going and didn't get relief or migraine didn't stop to call our office.

## 2016-11-03 MED ORDER — DEXAMETHASONE 2 MG PO TABS: ORAL_TABLET | ORAL | 0 refills | Status: DC

## 2016-11-03 MED ORDER — ZONISAMIDE 100 MG PO CAPS: 100.0000 mg | ORAL_CAPSULE | Freq: Two times a day (BID) | ORAL | 3 refills | Status: DC

## 2016-11-03 NOTE — Telephone Encounter (Signed)
Patient calling to discuss increasing dosage of zonisamide (ZONEGRAN) 100 MG capsule. She is still having headaches and was really bad during the night. Please call and advise.

## 2016-11-03 NOTE — Telephone Encounter (Signed)
I called patient. The patient has had some worsening headaches recently. The trial on Decadron 2 weeks ago significantly improved headaches for about 10 days, but the headaches have come back on again. We will redo the Decadron taper, Zonegran will be increased over the next week to a 100 mg dose twice daily. The patient is also on gabapentin taking 300 mg at night, she is  Tolerating this well, we may consider going up on this dose in the future as well.

## 2016-11-03 NOTE — Addendum Note (Signed)
Addended by: Margette Fast on: 11/03/2016 01:10 PM   Modules accepted: Orders

## 2016-11-19 ENCOUNTER — Telehealth: Payer: Self-pay | Admitting: Neurology

## 2016-11-19 NOTE — Telephone Encounter (Signed)
Pt called stating last night while turning over in her sleep she felt off balance waking up with horrible nausea and pain on the right side of her head.  This happened 2 times last night per husband patient woke up crying also crying out in her sleep in pain.  Pt has taken Toradol and ondansetron with no relief.  Patient says her eyes are kind of blurry where she cant read the words on the T.V. Please call

## 2016-11-19 NOTE — Telephone Encounter (Signed)
Dr.Athar please advise me thanks.

## 2016-11-19 NOTE — Telephone Encounter (Signed)
Rn call patient back about her symptoms. Pt stated the blurred vision is new,and it has not resolve. She is still nausea at times and off balance. Rn stated due to her sudden onset of symptoms Dr.Athar recommend Ed. Pt verbalized understanding.

## 2016-11-19 NOTE — Telephone Encounter (Signed)
Due to the sudden onset of symptoms, severe enough to cause distress, I recommend patient proceed to next ER - have her husband take her or if she cannot walk safely, call 911. Patient was last seen for routine FU for migraines by [facility] in Oct.  Pls call patient or husband back with my recommendation.

## 2016-11-24 NOTE — Telephone Encounter (Signed)
I called the patient, left a message, I will try to call back tomorrow morning.

## 2016-11-24 NOTE — Telephone Encounter (Signed)
Pt called to give f/u for ED visit @ [facility] on 11/19/16- CT was normal, IV of decadron,torodol,zofran, sodium cholride .9%,benadryl. She was given script for prednisone 20mg  2 for 3 days,2 for 2 days, then 2 x 1 days. She was there from 1:30 pm to 9:30pm.  She is requesting a call to discuss. Pt is aware Dr Jannifer Franklin is out of the office until tomorrow and this can wait until then.

## 2016-11-25 NOTE — Telephone Encounter (Signed)
I called the patient. She is having increased headache, at times with vertigo. We will go up on the zonegran to 150 bid, consider her for botox therapy. She is having 16 headache days a month and she is missing work frequently.

## 2016-12-11 ENCOUNTER — Other Ambulatory Visit: Payer: Self-pay | Admitting: Neurology

## 2017-01-06 ENCOUNTER — Other Ambulatory Visit: Payer: Self-pay | Admitting: Neurology

## 2017-01-08 ENCOUNTER — Other Ambulatory Visit: Payer: Self-pay | Admitting: Neurology

## 2017-01-08 MED ORDER — ZONISAMIDE 50 MG PO CAPS: 150.0000 mg | ORAL_CAPSULE | Freq: Two times a day (BID) | ORAL | 4 refills | Status: DC

## 2017-01-12 ENCOUNTER — Telehealth: Payer: Self-pay | Admitting: Neurology

## 2017-01-12 MED ORDER — DEXAMETHASONE 2 MG PO TABS: ORAL_TABLET | ORAL | 0 refills | Status: DC

## 2017-01-12 NOTE — Addendum Note (Signed)
Addended by: Margette Fast on: 01/12/2017 12:52 PM   Modules accepted: Orders

## 2017-01-12 NOTE — Telephone Encounter (Signed)
Dr Willis- please advise 

## 2017-01-12 NOTE — Telephone Encounter (Signed)
I called patient. The patient has had a headache over the last 4 days, I will call in a prescription for Decadron, this has helped previously. The patient is on Zonegran taking 150 mg twice daily.

## 2017-01-12 NOTE — Telephone Encounter (Signed)
Patient called office in reference to a migraine she has been having since Saturday.  Patient would like to know if she is able to have dexamethasone (DECADRON) 2 MG tablet called into [facility]

## 2017-01-25 ENCOUNTER — Ambulatory Visit (INDEPENDENT_AMBULATORY_CARE_PROVIDER_SITE_OTHER): Payer: Managed Care, Other (non HMO) | Admitting: Adult Health

## 2017-01-25 ENCOUNTER — Encounter: Payer: Self-pay | Admitting: Adult Health

## 2017-01-25 VITALS — BP 139/79 | HR 99 | Resp 20 | Ht 61.0 in | Wt 139.0 lb

## 2017-01-25 DIAGNOSIS — G43019 Migraine without aura, intractable, without status migrainosus: Secondary | ICD-10-CM | POA: Diagnosis not present

## 2017-01-25 NOTE — Progress Notes (Signed)
PATIENT: Michelle Reilly DOB: [birth year]  REASON FOR VISIT: follow up- migraine headache, meningioma the brain HISTORY FROM: patient  HISTORY OF PRESENT ILLNESS: Michelle Reilly is a 56 year old female with a history of migraine headaches and meningioma the brain. She returns today for follow-up. She is currently on Zonegran 150 mg twice a day. She reports that she continues to have frequent headaches throughout the month. She does feel that Zonegran has helped some but not resolved her headaches. She reports that she has 15-16 headache days a month. She states that her headaches began with a pressure sensation on the top of the head that radiates to the right temporal region and into the back of the head on the right side. With her headache she will have photophobia, phonophobia, nausea and occasionally will have vomiting. With severe headache she will use Toradol and Zofran. She does know that the weather is a trigger for her headaches. In the past the patient she has had used Decadron when her headache has lasted for several days. She has tried Zonegran, Topamax, gabapentin and Toradol. She did have a follow-up with her neurosurgeon Michelle Reilly who reported that her meningioma is stable. She returns today for an evaluation.  HISTORY 09/22/16: Michelle Reilly is a 56 year old female with a history of migraine headaches and meningioma of the brain. She returns today for follow-up. She was placed on Topamax however she could not tolerate this medication. She was started on Zonegran. She reports that her headaches have improved in frequency and severity. In the month of October she had 3 headaches however each headache can last 2-3 days. Her headaches typically occur on the right side of her head and spread throughout. She does confirm photophobia, phonophobia, nausea and vomiting. Denies any visual disturbance. She does note with severe headache she does have left-sided weakness. She is able to move but  occasionally will need help from her husband. She also notes that in September she started to have constipation. Her primary care has tried several medications. She did have 1 episode of urinary and bowel incontinence. She is being referred to GI. She states the constipation did occur around the same time she started Zonegran. However she does not want to stop this medication as she feels it is been very beneficial for her headaches and she would rather have constipation. She returns today for an evaluation.  REVIEW OF SYSTEMS: Out of a complete 14 system review of symptoms, the patient complains only of the following symptoms, and all other reviewed systems are negative.  Memory loss, dizziness, headache, speech difficulty, weakness, tremors, decreased concentration, joint pain, aching muscles, walking difficulty, constipation, excessive thirst, light sensitivity, fatigue, excessive sweating  ALLERGIES: Allergies  Allergen Reactions  . Other Other (See Comments)    Other reaction(s): Other Propafol (sedating agent) - causes severe congestion  . Hydrocodone-Acetaminophen Hives  . Oxycodone-Acetaminophen Itching    Other Reaction: itchy face  . Penicillins Hives    .Marland KitchenHas patient had a PCN reaction causing immediate rash, facial/tongue/throat swelling, SOB or lightheadedness with hypotension: No Has patient had a PCN reaction causing severe rash involving mucus membranes or skin necrosis: No Has patient had a PCN reaction that required hospitalization No Has patient had a PCN reaction occurring within the last 10 years: No If all of the above answers are "NO", then may proceed with Cephalosporin use.   Marland Kitchen Petrolatum-Zinc Oxide Other (See Comments)    Other reaction(s): Vomiting  . Propofol Other (See Comments)  After heart ablation, they told her she was allergic, all she remembers is congestion.  . Topamax [Topiramate] Other (See Comments)    Flu like symptoms  . Doxycycline Rash  .  Erythromycin Rash  . Nitrofurantoin Nausea And Vomiting  . Sulfa Antibiotics Nausea And Vomiting    HOME MEDICATIONS: Outpatient Medications Prior to Visit  Medication Sig Dispense Refill  . acetaminophen (TYLENOL) 500 MG tablet Take 1,000 mg by mouth every 6 (six) hours as needed for mild pain or headache.     Marland Kitchen buPROPion (WELLBUTRIN XL) 150 MG 24 hr tablet one tablet daily in the morning    . dexamethasone (DECADRON) 2 MG tablet Take 3 tablets the first day, 2 the second, one the third 6 tablet 0  . DULoxetine (CYMBALTA) 60 MG capsule Take 120 mg by mouth at bedtime.     . gabapentin (NEURONTIN) 300 MG capsule Take 300 mg by mouth at bedtime.     Marland Kitchen ketorolac (TORADOL) 10 MG tablet TAKE 1 TABLET (10 MG TOTAL) BY MOUTH EVERY 6 (SIX) HOURS AS NEEDED. 30 tablet 5  . ondansetron (ZOFRAN) 4 MG tablet Take 1 tablet (4 mg total) by mouth every 8 (eight) hours as needed for nausea or vomiting. 30 tablet 2  . traMADol (ULTRAM) 50 MG tablet Take 100 mg by mouth as needed for moderate pain.     . verapamil (VERELAN) 100 MG 24 hr capsule Take 100 mg by mouth at bedtime.     . Vitamin D, Ergocalciferol, (DRISDOL) 50000 UNITS CAPS capsule Take 50,000 Units by mouth once a week.    . zonisamide (ZONEGRAN) 50 MG capsule Take 3 capsules (150 mg total) by mouth 2 (two) times daily. 180 capsule 4  . dicyclomine (BENTYL) 20 MG tablet Take 1 tablet (20 mg total) by mouth 3 (three) times daily as needed for spasms (abdominal cramping/pain). (Patient not taking: Reported on 09/22/2016) 20 tablet 0  . linaclotide (LINZESS) 72 MCG capsule Take 144 mcg by mouth daily before breakfast.    . magnesium citrate SOLN Take 148 mLs (0.5 Bottles total) by mouth once as needed for severe constipation. (Patient not taking: Reported on 09/22/2016) 296 mL 0   No facility-administered medications prior to visit.     PAST MEDICAL HISTORY: Past Medical History:  Diagnosis Date  . Classical migraine with intractable migraine  07/23/2016  . Depression   . Fibromyalgia   . Meningioma ([place])   . 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 Tachycardia    superior Ventriculat    PAST SURGICAL HISTORY: Past Surgical History:  Procedure Laterality Date  . BRAIN MENINGIOMA EXCISION  05/2008   Gamma knife procedure 2016  . ENDOMETRIAL ABLATION  8/06  . heart ablation  2/12    FAMILY HISTORY: Family History  Problem Relation Age of Onset  . Diabetes Mother   . Arthritis/Rheumatoid Mother   . Addison's disease Mother   . Anemia Mother   . Migraines Mother   . Cancer - Lung Father   . Metabolic syndrome Brother   . Migraines Brother   . Migraines Brother     SOCIAL HISTORY: Social History   Social History  . Marital status: Married    Spouse name: N/A  . Number of children: 2  . Years of education: college   Occupational History  . [employer]    Social History Main Topics  . Smoking status: Never Smoker  . Smokeless tobacco: Never Used  . Alcohol  use Yes  . Drug use: No  . Sexual activity: Yes   Other Topics Concern  . Not on file   Social History Narrative  . No narrative on file      PHYSICAL EXAM  Vitals:   01/25/17 0727  BP: 139/79  Pulse: 99  Resp: 20  Weight: 139 lb (63 kg)  Height: 5\' 1"  (1.549 m)   Body mass index is 26.26 kg/m.  Generalized: Well developed, in no acute distress   Neurological examination  Mentation: Alert oriented to time, place, history taking. Follows all commands speech and language fluent Cranial nerve II-XII: Pupils were equal round reactive to light. Extraocular movements were full, visual field were full on confrontational test. Facial sensation and strength were normal. Uvula tongue midline. Head turning and shoulder shrug  were normal and symmetric. Motor: The motor testing reveals 5 over 5 strength of all 4 extremities. Good symmetric motor tone is noted throughout.  Sensory: Sensory testing is intact to soft touch on all 4  extremities. No evidence of extinction is noted.  Coordination: Cerebellar testing reveals good finger-nose-finger and heel-to-shin bilaterally.  Gait and station: Gait is normal. Tandem gait is slightly unstable. Romberg is negative. No drift is seen.  Reflexes: Deep tendon reflexes are symmetric and normal bilaterally.   DIAGNOSTIC DATA (LABS, IMAGING, TESTING) - I reviewed patient records, labs, notes, testing and imaging myself where available.  Lab Results  Component Value Date   WBC 5.6 08/31/2016   HGB 13.5 08/31/2016   HCT 40.3 08/31/2016   MCV 92.9 08/31/2016   PLT 287 08/31/2016      Component Value Date/Time   NA 141 08/31/2016 0755   K 4.1 08/31/2016 0755   CL 107 08/31/2016 0755   CO2 29 08/31/2016 0755   GLUCOSE 88 08/31/2016 0755   BUN 11 08/31/2016 0755   CREATININE 0.95 08/31/2016 0755   CALCIUM 9.4 08/31/2016 0755   PROT 6.9 08/31/2016 0755   ALBUMIN 4.0 08/31/2016 0755   AST 17 08/31/2016 0755   ALT 16 08/31/2016 0755   ALKPHOS 90 08/31/2016 0755   BILITOT 0.8 08/31/2016 0755   GFRNONAA >60 08/31/2016 0755   GFRAA >60 08/31/2016 0755      ASSESSMENT AND PLAN 56 y.o. 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 here with:  1. Migraine headaches  The patient continues to have frequent migraines. She will continue on Zonegran 150 mg twice a day. I have discussed Botox therapy with the patient. She is amenable to trying this. We will get her set up for Botox. She will continue using Toradol and Zofran as needed. She will follow-up in 4-5 months or sooner if needed.  I spent 15 minutes with the patient 50% of this time was spent reviewing Botox therapy with the patient.     Ward Givens, MSN, NP-C 01/25/2017, 7:38 AM [facility] [address] [place], [place] [address] [phone]

## 2017-01-25 NOTE — Patient Instructions (Signed)
Continue Zonegran  Will get you setup for botox If your symptoms worsen or you develop new symptoms please let us know.

## 2017-01-25 NOTE — Progress Notes (Signed)
I have read the note, and I agree with the clinical assessment and plan.  WILLIS,CHARLES KEITH   

## 2017-02-17 ENCOUNTER — Ambulatory Visit: Payer: Managed Care, Other (non HMO) | Admitting: Neurology

## 2017-03-01 ENCOUNTER — Encounter: Payer: Self-pay | Admitting: Adult Health

## 2017-03-01 ENCOUNTER — Ambulatory Visit (INDEPENDENT_AMBULATORY_CARE_PROVIDER_SITE_OTHER): Payer: Managed Care, Other (non HMO) | Admitting: Adult Health

## 2017-03-01 VITALS — BP 132/76 | HR 98 | Resp 20 | Ht 61.0 in | Wt 140.0 lb

## 2017-03-01 DIAGNOSIS — G43019 Migraine without aura, intractable, without status migrainosus: Secondary | ICD-10-CM | POA: Diagnosis not present

## 2017-03-01 DIAGNOSIS — G43719 Chronic migraine without aura, intractable, without status migrainosus: Secondary | ICD-10-CM

## 2017-03-01 NOTE — Progress Notes (Addendum)
Consent Form Botulism Toxin Injection For Chronic Migraine  Botulism toxin has been approved by the Federal drug administration for treatment of chronic migraine. Botulism toxin does not cure chronic migraine and it may not be effective in some patients.  The administration of botulism toxin is accomplished by injecting a small amount of toxin into the muscles of the neck and head. Dosage must be titrated for each individual. Any benefits resulting from botulism toxin tend to wear off after 3 months with a repeat injection required if benefit is to be maintained. Injections are usually done every 3-4 months with maximum effect peak achieved by about 2 or 3 weeks. Botulism toxin is expensive and you should be sure of what costs you will incur resulting from the injection.  The side effects of botulism toxin use for chronic migraine may include:   -Transient, and usually mild, facial weakness with facial injections  -Transient, and usually mild, head or neck weakness with head/neck injections  -Reduction or loss of forehead facial animation due to forehead muscle              weakness  -Eyelid drooping  -Dry eye  -Pain at the site of injection or bruising at the site of injection  -Double vision  -Potential unknown long term risks  Contraindications: You should not have Botox if you are pregnant, nursing, allergic to albumin, have an infection, skin condition, or muscle weakness at the site of the injection, or have myasthenia gravis, Lambert-Eaton syndrome, or ALS.  It is also possible that as with any injection, there may be an allergic reaction or no effect from the medication. Reduced effectiveness after repeated injections is sometimes seen and rarely infection at the injection site may occur. All care will be taken to prevent these side effects. If therapy is given over a long time, atrophy and wasting in the muscle injected may occur. Occasionally the patient's become refractory to  treatment because they develop antibodies to the toxin. In this event, therapy needs to be modified.  I have read the above information and consent to the administration of botulism toxin.    ______________  _____   _________________  Patient signature     Date   Witness signature       BOTOX PROCEDURE NOTE FOR MIGRAINE HEADACHE    Contraindications and precautions discussed with patient(above). Aseptic procedure was observed and patient tolerated procedure. Procedure performed by Ward Givens NP.  The condition has existed for more than 6 months, and pt does not have a diagnosis of ALS, Myasthenia Gravis or Lambert-Eaton Syndrome. Risks and benefits of injections discussed and pt agrees to proceed with the procedure. Written consent obtained  These injections are medically necessary. He receives good benefits from these injections. These injections do not cause sedations or hallucinations which the oral therapies may cause.  Indication/Diagnosis: chronic migraine BOTOX(J0585) injection was performed according to protocol by [employer]. 200 units of BOTOX was dissolved into 4 cc NS.    Type of toxin: Botox NDC 0023-3921-02 Lot # [id number] EXP: 06/2017   Description of procedure:  C3The patient was placed in a sitting position. The standard protocol was used for Botox as follows, with 5 units of Botox injected at each site:   -Procerus muscle, midline injection  -Corrugator muscle, bilateral injection  -Frontalis muscle, bilateral injection, with 2 sites each side, medial injection was performed in the upper one third of the frontalis muscle, in the region vertical from the medial inferior  edge of the superior orbital rim. The lateral injection was again in the upper one third of the forehead vertically above the lateral limbus of the cornea, 1.5 cm lateral to the medial injection site.  -Temporalis muscle injection, 4 sites, bilaterally. The first injection was 3 cm above  the tragus of the ear, second injection site was 1.5 cm to 3 cm up from the first injection site in line with the tragus of the ear. The third injection site was 1.5-3 cm forward between the first 2 injection sites. The fourth injection site was 1.5 cm posterior to the second injection site.  -Occipitalis muscle injection, 3 sites, bilaterally. The first injection was done one half way between the occipital protuberance and the tip of the mastoid process behind the ear. The second injection site was done lateral and superior to the first, 1 fingerbreadth from the first injection. The third injection site was 1 fingerbreadth superiorly and medially from the first injection site.  -Cervical paraspinal muscle injection, 2 sites, bilateral knee first injection site was 1 cm from the midline of the cervical spine, 3 cm inferior to the lower border of the occipital protuberance. The second injection site was 1.5 cm superiorly and laterally to the first injection site.  -Trapezius muscle injection was performed at 3 sites, bilaterally. The first injection site was in the upper trapezius muscle halfway between the inflection point of the neck, and the acromion. The second injection site was one half way between the acromion and the first injection site. The third injection was done between the first injection site and the inflection point of the neck.   Will return for repeat injection in 3 months.   A 200 unit sof Botox was used, 155 units were injected, the rest of the Botox was wasted. The patient tolerated the procedure well. She did get nauseasous after injections in the Occipitalis muscle. She reports that she did not eat breakfast. She was given juice and crackers and she felt better. Otherwise, there were no complications of the above procedure.   Ward Givens, MSN, NP-C 03/01/17 4:28 PM [facility] [address], [place], [place] [address] [phone]

## 2017-05-03 ENCOUNTER — Other Ambulatory Visit: Payer: Self-pay | Admitting: Adult Health

## 2017-05-03 NOTE — Telephone Encounter (Signed)
Pt request refill for dexamethasone (DECADRON) 2 MG tablet sent to [facility]. Pt said she's had migraine for 4 days.

## 2017-05-04 MED ORDER — DEXAMETHASONE 2 MG PO TABS: ORAL_TABLET | ORAL | 0 refills | Status: DC

## 2017-05-04 NOTE — Telephone Encounter (Signed)
Spoke to pt and she is day 5 of migraine, which waxes and wains with taking of toradol.  She would like to be prescribed decadron 3 day course like in 01/2017 (per Dr. Jannifer Franklin)  which helped.  Please advise.

## 2017-05-04 NOTE — Telephone Encounter (Signed)
Spoke to pt and let her know that prescription was called in for the decadron, she stated that [facility] had already [facility]ed [facility].  She will let us know if that does not work.

## 2017-05-04 NOTE — Addendum Note (Signed)
Addended by: Trudie Buckler on: 05/04/2017 01:09 PM   Modules accepted: Orders

## 2017-05-04 NOTE — Telephone Encounter (Signed)
Decadron sent to pharmacy. Please call patient.

## 2017-05-04 NOTE — Telephone Encounter (Signed)
Pt called back today, said she is on day 5 with migraine. Please call her at [phone]

## 2017-05-25 ENCOUNTER — Ambulatory Visit (INDEPENDENT_AMBULATORY_CARE_PROVIDER_SITE_OTHER): Payer: Managed Care, Other (non HMO) | Admitting: Adult Health

## 2017-05-25 ENCOUNTER — Encounter: Payer: Self-pay | Admitting: Adult Health

## 2017-05-25 VITALS — BP 124/79 | HR 100 | Ht 61.0 in | Wt 139.0 lb

## 2017-05-25 DIAGNOSIS — G43019 Migraine without aura, intractable, without status migrainosus: Secondary | ICD-10-CM | POA: Diagnosis not present

## 2017-05-25 NOTE — Patient Instructions (Signed)
Your Plan:  Continue Zonegran 150 mg twice a day Botox injections next week Continue to keep headache journal.  Thank you for coming to see [place] at [facility]. I hope we have been able to provide you high quality care today.  You may receive a patient satisfaction survey over the next few weeks. We would appreciate your feedback and comments so that we may continue to improve ourselves and the health of our patients.

## 2017-05-25 NOTE — Progress Notes (Signed)
I have read the note, and I agree with the clinical assessment and plan.  Mccall Lomax KEITH   

## 2017-05-25 NOTE — Progress Notes (Signed)
PATIENT: MARRIANA Reilly DOB: 23-Jun-1961  REASON FOR VISIT: follow up- migraine HISTORY FROM: patient  HISTORY OF PRESENT ILLNESS: Michelle Reilly is a 56 year old female with a history of migraine headaches and meningioma of the brain. She returns today for follow-up. She is currently on Zonegran 150 mg twice a day. She has received one round of Botox injections. She reports that her headache frequency has decreased. She reports that she did not have any headache days in April after injections. She states in May she only had 2 headaches. She reports her headache frequency has picked back up in June but still less than what it was. She is very happy with the injections and would like to continue those. She reports that her headaches still occur on the right side. She does have light and noise sensitivity as well as nausea. She states receiving Botox in the shoulders is also relieved her fibromyalgia pain. She reports that she recently had a fall. She states that all put her down. She did have x-rays and reportedly she had no fractures. She states that she is very sore. She also states that since her last visit she had a colonoscopy and it was reported that she has an ileus in the small intestines. She returns today for an evaluation.   HISTORY Michelle Reilly is a 56 year old female with a history of migraine headaches and meningioma the brain. She returns today for follow-up. She is currently on Zonegran 150 mg twice a day. She reports that she continues to have frequent headaches throughout the month. She does feel that Zonegran has helped some but not resolved her headaches. She reports that she has 15-16 headache days a month. She states that her headaches began with a pressure sensation on the top of the head that radiates to the right temporal region and into the back of the head on the right side. With her headache she will have photophobia, phonophobia, nausea and occasionally will have vomiting. With  severe headache she will use Toradol and Zofran. She does know that the weather is a trigger for her headaches. In the past the patient she has had used Decadron when her headache has lasted for several days. She has tried Zonegran, Topamax, gabapentin and Toradol. She did have a follow-up with her neurosurgeon Dr. Salomon Fick who reported that her meningioma is stable. She returns today for an evaluation.  HISTORY 09/22/16: Michelle Reilly is a 56 year old female with a history of migraine headaches and meningioma of the brain. She returns today for follow-up. She was placed on Topamax however she could not tolerate this medication. She was started on Zonegran. She reports that her headaches have improved in frequency and severity. In the month of October she had 3 headaches however each headache can last 2-3 days. Her headaches typically occur on the right side of her head and spread throughout. She does confirm photophobia, phonophobia, nausea and vomiting. Denies any visual disturbance. She does note with severe headache she does have left-sided weakness. She is able to move but occasionally will need help from her husband. She also notes that in September she started to have constipation. Her primary care has tried several medications. She did have 1 episode of urinary and bowel incontinence. She is being referred to GI. She states the constipation did occur around the same time she started Zonegran. However she does not want to stopthis medication as she feels it is been very beneficial for her headaches and she would rather  have constipation. She returns today for an evaluation.   REVIEW OF SYSTEMS: Out of a complete 14 system review of symptoms, the patient complains only of the following symptoms, and all other reviewed systems are negative.  Aching muscles, walking difficulty, tremors, frequent waking, cough, fatigue, excessive sweating  ALLERGIES: Allergies  Allergen Reactions  . Other Other (See  Comments)    Other reaction(s): Other Propafol (sedating agent) - causes severe congestion  . Hydrocodone-Acetaminophen Hives  . Oxycodone-Acetaminophen Itching    Other Reaction: itchy face  . Penicillins Hives    .Marland KitchenHas patient had a PCN reaction causing immediate rash, facial/tongue/throat swelling, SOB or lightheadedness with hypotension: No Has patient had a PCN reaction causing severe rash involving mucus membranes or skin necrosis: No Has patient had a PCN reaction that required hospitalization No Has patient had a PCN reaction occurring within the last 10 years: No If all of the above answers are "NO", then may proceed with Cephalosporin use.   Marland Kitchen Petrolatum-Zinc Oxide Other (See Comments)    Other reaction(s): Vomiting  . Propofol Other (See Comments)    After heart ablation, they told her she was allergic, all she remembers is congestion.  . Topamax [Topiramate] Other (See Comments)    Flu like symptoms  . Doxycycline Rash  . Erythromycin Rash  . Nitrofurantoin Nausea And Vomiting  . Sulfa Antibiotics Nausea And Vomiting    HOME MEDICATIONS: Outpatient Medications Prior to Visit  Medication Sig Dispense Refill  . acetaminophen (TYLENOL) 500 MG tablet Take 1,000 mg by mouth every 6 (six) hours as needed for mild pain or headache.     Marland Kitchen buPROPion (WELLBUTRIN XL) 150 MG 24 hr tablet one tablet daily in the morning    . dexamethasone (DECADRON) 2 MG tablet Take 3 tablets the first day, 2 the second, one the third 6 tablet 0  . Docusate Calcium (STOOL SOFTENER PO) Take by mouth 2 (two) times daily.     . DULoxetine (CYMBALTA) 60 MG capsule Take 120 mg by mouth at bedtime.     . gabapentin (NEURONTIN) 300 MG capsule Take 300 mg by mouth at bedtime.     Marland Kitchen ketorolac (TORADOL) 10 MG tablet TAKE 1 TABLET (10 MG TOTAL) BY MOUTH EVERY 6 (SIX) HOURS AS NEEDED. 30 tablet 5  . ondansetron (ZOFRAN) 4 MG tablet Take 1 tablet (4 mg total) by mouth every 8 (eight) hours as needed for nausea or  vomiting. 30 tablet 2  . traMADol (ULTRAM) 50 MG tablet Take 100 mg by mouth as needed for moderate pain.     . verapamil (VERELAN) 100 MG 24 hr capsule Take 100 mg by mouth at bedtime.     . Vitamin D, Ergocalciferol, (DRISDOL) 50000 UNITS CAPS capsule Take 50,000 Units by mouth once a week.    . zonisamide (ZONEGRAN) 50 MG capsule Take 3 capsules (150 mg total) by mouth 2 (two) times daily. 180 capsule 4   No facility-administered medications prior to visit.     PAST MEDICAL HISTORY: Past Medical History:  Diagnosis Date  . Classical migraine with intractable migraine 07/23/2016  . Depression   . Fibromyalgia   . Meningioma ([place])   . 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 Tachycardia    superior Ventriculat    PAST SURGICAL HISTORY: Past Surgical History:  Procedure Laterality Date  . BRAIN MENINGIOMA EXCISION  05/2008   Gamma knife procedure 2016  . ENDOMETRIAL ABLATION  8/06  . heart ablation  2/12    FAMILY HISTORY: Family History  Problem Relation Age of Onset  . Diabetes Mother   . Arthritis/Rheumatoid Mother   . Addison's disease Mother   . Anemia Mother   . Migraines Mother   . Cancer - Lung Father   . Metabolic syndrome Brother   . Migraines Brother   . Migraines Brother     SOCIAL HISTORY: Social History   Social History  . Marital status: Married    Spouse name: N/A  . Number of children: 2  . Years of education: college   Occupational History  . [employer]    Social History Main Topics  . Smoking status: Never Smoker  . Smokeless tobacco: Never Used  . Alcohol use Yes  . Drug use: No  . Sexual activity: Yes   Other Topics Concern  . Not on file   Social History Narrative  . No narrative on file      PHYSICAL EXAM  Vitals:   05/25/17 0728  BP: 124/79  Pulse: 100  Weight: 139 lb (63 kg)  Height: 5\' 1"  (1.549 m)   Body mass index is 26.26 kg/m.  Generalized: Well developed, in no acute distress   Neurological  examination  Mentation: Alert oriented to time, place, history taking. Follows all commands speech and language fluent Cranial nerve II-XII: Pupils were equal round reactive to light. Extraocular movements were full, visual field were full on confrontational test. Facial sensation and strength were normal. Uvula tongue midline. Head turning and shoulder shrug  were normal and symmetric. Motor: The motor testing reveals 5 over 5 strength of all 4 extremities. Good symmetric motor tone is noted throughout.  Sensory: Sensory testing is intact to soft touch on all 4 extremities. No evidence of extinction is noted.  Coordination: Cerebellar testing reveals good finger-nose-finger and heel-to-shin bilaterally.  Gait and station: Gait is normal. Tandem gait is normal. Romberg is negative. No drift is seen.  Reflexes: Deep tendon reflexes are symmetric and normal bilaterally.   DIAGNOSTIC DATA (LABS, IMAGING, TESTING) - I reviewed patient records, labs, notes, testing and imaging myself where available.  Lab Results  Component Value Date   WBC 5.6 08/31/2016   HGB 13.5 08/31/2016   HCT 40.3 08/31/2016   MCV 92.9 08/31/2016   PLT 287 08/31/2016      Component Value Date/Time   NA 141 08/31/2016 0755   K 4.1 08/31/2016 0755   CL 107 08/31/2016 0755   CO2 29 08/31/2016 0755   GLUCOSE 88 08/31/2016 0755   BUN 11 08/31/2016 0755   CREATININE 0.95 08/31/2016 0755   CALCIUM 9.4 08/31/2016 0755   PROT 6.9 08/31/2016 0755   ALBUMIN 4.0 08/31/2016 0755   AST 17 08/31/2016 0755   ALT 16 08/31/2016 0755   ALKPHOS 90 08/31/2016 0755   BILITOT 0.8 08/31/2016 0755   GFRNONAA >60 08/31/2016 0755   GFRAA >60 08/31/2016 0755      ASSESSMENT AND PLAN 56 y.o. 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 here with:  1. Migraine  headache  Overall the patient is doing well. She will continue on Zonegran 150 mg twice a day. She will receive her next round of Botox injections next week. She is advised that if her symptoms worsen or she develops new symptoms she should let us know. She will follow-up in 6 months or sooner  if needed.  I spent 15 minutes with the patient. 50% of this time was spent discussing Botox injections.    Ward Givens, MSN, NP-C 05/25/2017, 7:16 AM [facility] [address], [place], [place] [address] [phone]

## 2017-06-02 ENCOUNTER — Encounter: Payer: Self-pay | Admitting: Adult Health

## 2017-06-02 ENCOUNTER — Ambulatory Visit (INDEPENDENT_AMBULATORY_CARE_PROVIDER_SITE_OTHER): Payer: Managed Care, Other (non HMO) | Admitting: Adult Health

## 2017-06-02 VITALS — BP 128/74 | HR 97 | Ht 61.0 in | Wt 141.6 lb

## 2017-06-02 DIAGNOSIS — G43719 Chronic migraine without aura, intractable, without status migrainosus: Secondary | ICD-10-CM | POA: Diagnosis not present

## 2017-06-02 NOTE — Progress Notes (Signed)
**  Botox 100 units x 2 vials, NDC 6168-3729-02, XJD#[id number], Exp 11/2019, specialty pharmacy.//mck**

## 2017-06-02 NOTE — Progress Notes (Addendum)

## 2017-06-23 ENCOUNTER — Telehealth: Payer: Self-pay | Admitting: Adult Health

## 2017-06-23 MED ORDER — DEXAMETHASONE 2 MG PO TABS: ORAL_TABLET | ORAL | 0 refills | Status: DC

## 2017-06-23 NOTE — Telephone Encounter (Signed)
Pt called the office regarding a HA she's had since Monday of this week. She has tried Toradol, tramadol and OTC tylenol and ice with no success. She is wanting RX for dexamethasone (DECADRON) 2 MG tablet sent to [facility].

## 2017-06-23 NOTE — Telephone Encounter (Signed)
I lmvm for pt that decadron called in for her.

## 2017-06-23 NOTE — Telephone Encounter (Signed)
Decadron sent for the patient. Her last dose pack was June 6. We will need to monitor this. She is currently receiving Botox injections.

## 2017-06-25 ENCOUNTER — Other Ambulatory Visit: Payer: Self-pay | Admitting: Neurology

## 2017-06-30 NOTE — Telephone Encounter (Signed)
Patient called office in reference to Decadron.  Patient said she had 2 days of relief but migraine has returned causing patient to be out of work today due to late of sleep, pain, hard to concentrate.  Patient would like to know what she can do.  Please call

## 2017-06-30 NOTE — Telephone Encounter (Signed)
I called patient. The patient had Decadron last week, it gave her 2 days of relief but the headache is now back again. She is having headaches almost every day.  In the past she has responded to Depacon, we'll get the injection tomorrow morning with IV Toradol.

## 2017-07-01 ENCOUNTER — Encounter: Payer: Self-pay | Admitting: Neurology

## 2017-07-15 ENCOUNTER — Telehealth: Payer: Self-pay | Admitting: Neurology

## 2017-07-15 MED ORDER — PREDNISONE 10 MG PO TABS: ORAL_TABLET | ORAL | 0 refills | Status: DC

## 2017-07-15 NOTE — Telephone Encounter (Signed)
Pt calling to inform that even after her infusion she has had a migraine everyday and that yesterday 8-16 it was so bad she missed a day from work.  Pt said that she has been taking her ketorolac (TORADOL) 10 MG tablet and tramadol with no relief

## 2017-07-15 NOTE — Addendum Note (Signed)
Addended by: Kathrynn Ducking on: 07/15/2017 02:12 PM   Modules accepted: Orders

## 2017-07-15 NOTE — Telephone Encounter (Signed)
I called patient. The patient is having ongoing daily headaches over the last 2 weeks, I will give her a 12 day course of prednisone. If the headaches continue, she is to contact our office. The Decadron three-day course previously did help.

## 2017-08-02 MED ORDER — GABAPENTIN 300 MG PO CAPS: ORAL_CAPSULE | ORAL | 3 refills | Status: DC

## 2017-08-02 NOTE — Telephone Encounter (Signed)
Patient called office in reference to continuing to having headaches after completing prednisone.  Patient states she had 1 headache near ending the prednisone completing prednisone last Tuesday and having a headache since Thursday.  On Saturday headache was very severe causing left sided weakness.  Please call

## 2017-08-02 NOTE — Addendum Note (Signed)
Addended by: Kathrynn Ducking on: 08/02/2017 04:52 PM   Modules accepted: Orders

## 2017-08-02 NOTE — Telephone Encounter (Signed)
I called patient. The patient did well on the prednisone, but his symptoms this was tapered off, the headache came back.  The patient will go up on the gabapentin taking 300 mg twice daily, then go to 1 in the morning and 2 in the evening.  She will stay on Zomig for now.  She has been on Cymbalta for quite some time, this likely is not the source of her increased headache.  If the Botox is not doing well for her in the future, we may consider switching to Aimovig.

## 2017-08-05 ENCOUNTER — Telehealth: Payer: Self-pay | Admitting: Neurology

## 2017-08-05 NOTE — Telephone Encounter (Signed)
I called patient. The patient sent in a disability form, I contacted about this, she apparently has gone out of work on 07/29/2017. The patient indicates that she wanted an FMLA form filled out, not a disability form.  She will get the [facility] form sent to our office.

## 2017-08-05 NOTE — Telephone Encounter (Signed)
Addendum on prior phone note, the patient is to stay on Zonegran, not Zomig.

## 2017-08-08 ENCOUNTER — Telehealth: Payer: Self-pay | Admitting: Neurology

## 2017-08-08 MED ORDER — PREDNISONE 10 MG PO TABS: ORAL_TABLET | ORAL | 0 refills | Status: DC
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
## 2017-08-08 NOTE — Telephone Encounter (Signed)
Patient calling to discuss migraine she has had x2 days and last night she lost all control of her bowels. Please call to discuss. She said she wanted advise from Dr. Jannifer Franklin.

## 2017-08-08 NOTE — Addendum Note (Signed)
Addended by: Kathrynn Ducking on: 08/08/2017 05:26 PM   Modules accepted: Orders

## 2017-08-09 NOTE — Telephone Encounter (Signed)
Pt came in this am to sign Aimovig forms.  Faxed completed/signed aimovig service request form and prescription to [employer]. Fax: [phone]. Received confirmation. Also received confirmation page that fax was received.

## 2017-09-02 ENCOUNTER — Other Ambulatory Visit: Payer: Self-pay | Admitting: Neurology

## 2017-09-02 MED ORDER — ERENUMAB-AOOE 70 MG/ML ~~LOC~~ SOAJ: 140.0000 mg | SUBCUTANEOUS | 3 refills | Status: DC

## 2017-09-05 ENCOUNTER — Telehealth: Payer: Self-pay | Admitting: *Deleted

## 2017-09-05 NOTE — Telephone Encounter (Signed)
Completed PA Aimovig on covermymeds. Key: [id number].  [id number];DZHGDJ:[id number];Review Type:Prior Auth;Coverage Start Date:08/06/2017;Coverage End Date:09/05/2018;  Patient tried/failed meds: zonisamide, botox, topitamate, gabapentin, duloxetine, verapamil, amitriptyline, toprol XL, tylenol, ibuprofen, rizatriptan.

## 2017-09-07 ENCOUNTER — Telehealth: Payer: Self-pay | Admitting: Adult Health

## 2017-09-07 ENCOUNTER — Encounter: Payer: Self-pay | Admitting: Adult Health

## 2017-09-07 ENCOUNTER — Ambulatory Visit (INDEPENDENT_AMBULATORY_CARE_PROVIDER_SITE_OTHER): Payer: Managed Care, Other (non HMO) | Admitting: Adult Health

## 2017-09-07 VITALS — BP 124/79 | HR 98 | Ht 64.5 in | Wt 141.0 lb

## 2017-09-07 DIAGNOSIS — G43719 Chronic migraine without aura, intractable, without status migrainosus: Secondary | ICD-10-CM

## 2017-09-07 NOTE — Telephone Encounter (Signed)
Ok, done 

## 2017-09-07 NOTE — Progress Notes (Signed)
Consent Form Botulism Toxin Injection For Chronic Migraine  Botulism toxin has been approved by the Federal drug administration for treatment of chronic migraine. Botulism toxin does not cure chronic migraine and it may not be effective in some patients.  The administration of botulism toxin is accomplished by injecting a small amount of toxin into the muscles of the neck and head. Dosage must be titrated for each individual. Any benefits resulting from botulism toxin tend to wear off after 3 months with a repeat injection required if benefit is to be maintained. Injections are usually done every 3-4 months with maximum effect peak achieved by about 2 or 3 weeks. Botulism toxin is expensive and you should be sure of what costs you will incur resulting from the injection.  The side effects of botulism toxin use for chronic migraine may include:   -Transient, and usually mild, facial weakness with facial injections  -Transient, and usually mild, head or neck weakness with head/neck injections  -Reduction or loss of forehead facial animation due to forehead muscle              weakness  -Eyelid drooping  -Dry eye  -Pain at the site of injection or bruising at the site of injection  -Double vision  -Potential unknown long term risks  Contraindications: You should not have Botox if you are pregnant, nursing, allergic to albumin, have an infection, skin condition, or muscle weakness at the site of the injection, or have myasthenia gravis, Lambert-Eaton syndrome, or ALS.  It is also possible that as with any injection, there may be an allergic reaction or no effect from the medication. Reduced effectiveness after repeated injections is sometimes seen and rarely infection at the injection site may occur. All care will be taken to prevent these side effects. If therapy is given over a long time, atrophy and wasting in the muscle injected may occur. Occasionally the patient's become refractory to  treatment because they develop antibodies to the toxin. In this event, therapy needs to be modified.  I have read the above information and consent to the administration of botulism toxin.    ______________  _____   _________________  Patient signature     Date   Witness signature       BOTOX PROCEDURE NOTE FOR MIGRAINE HEADACHE    Contraindications and precautions discussed with patient(above). Aseptic procedure was observed and patient tolerated procedure. Procedure performed by Ward Givens  The condition has existed for more than 6 months, and pt does not have a diagnosis of ALS, Myasthenia Gravis or Lambert-Eaton Syndrome. Risks and benefits of injections discussed and pt agrees to proceed with the procedure. Written consent obtained  These injections are medically necessary. He receives good benefits from these injections. These injections do not cause sedations or hallucinations which the oral therapies may cause.  Indication/Diagnosis: chronic migraine BOTOX(J0585) injection was performed according to protocol by [employer]. 200 units of BOTOX was dissolved into 4 cc NS.  NDC: [address]-24  Botox-100unitsx2 vials Lot: [id number] Expiration: 02/2020 NDC: 9924-2683-41 [id number]  0.9% Sodium Chloride- 76mL total Lot: [id number] Expiration: 11/2017 NDC: 2119-4174-08  Description of procedure:  The patient was placed in a sitting position. The standard protocol was used for Botox as follows, with 5 units of Botox injected at each site:   -Procerus muscle, midline injection  -Corrugator muscle, bilateral injection  -Frontalis muscle, bilateral injection, with 2 sites each side, medial injection was performed in the upper one third of the frontalis muscle,  in the region vertical from the medial inferior edge of the superior orbital rim. The lateral injection was again in the upper one third of the forehead vertically above the lateral limbus of the cornea, 1.5 cm  lateral to the medial injection site.  -Temporalis muscle injection, 4 sites, bilaterally. The first injection was 3 cm above the tragus of the ear, second injection site was 1.5 cm to 3 cm up from the first injection site in line with the tragus of the ear. The third injection site was 1.5-3 cm forward between the first 2 injection sites. The fourth injection site was 1.5 cm posterior to the second injection site.  -Occipitalis muscle injection, 3 sites, bilaterally. The first injection was done one half way between the occipital protuberance and the tip of the mastoid process behind the ear. The second injection site was done lateral and superior to the first, 1 fingerbreadth from the first injection. The third injection site was 1 fingerbreadth superiorly and medially from the first injection site.  -Cervical paraspinal muscle injection, 2 sites, bilateral knee first injection site was 1 cm from the midline of the cervical spine, 3 cm inferior to the lower border of the occipital protuberance. The second injection site was 1.5 cm superiorly and laterally to the first injection site.  -Trapezius muscle injection was performed at 3 sites, bilaterally. The first injection site was in the upper trapezius muscle halfway between the inflection point of the neck, and the acromion. The second injection site was one half way between the acromion and the first injection site. The third injection was done between the first injection site and the inflection point of the neck.   Will return for repeat injection in 3 months.   A 200 unit sof Botox was used, 155 units were injected, the rest of the Botox was wasted. The patient tolerated the procedure well, there were no complications of the above procedure.   Ward Givens, MSN, NP-C 09/07/2017, 8:54 AM [facility] [address] [place], [place] [address] [phone]

## 2017-09-07 NOTE — Progress Notes (Signed)
Botox-100unitsx2 vials Lot: [id number] Expiration: 02/2020 NDC: 6484-7207-21 [id number]  0.9% Sodium Chloride- 77mL total Lot: [id number] Expiration: 11/2017 NDC: 1460-4799-87  Dx: A15.872 S/P

## 2017-09-07 NOTE — Telephone Encounter (Signed)
Michelle Reilly, please work the patient into the 3:30 slot on January 16th, this is ok per [place] but epic will not allow me to work in.

## 2017-09-15 ENCOUNTER — Telehealth: Payer: Self-pay | Admitting: *Deleted

## 2017-09-15 NOTE — Telephone Encounter (Signed)
FMLA ([employer]) filled out, for MM/NP for review and signature.

## 2017-09-15 NOTE — Telephone Encounter (Signed)
Pt form faxed to [employer] on 09/15/17. Copy mailed out to pt.

## 2017-09-16 DIAGNOSIS — Z0289 Encounter for other administrative examinations: Secondary | ICD-10-CM

## 2017-11-09 ENCOUNTER — Other Ambulatory Visit: Payer: Self-pay | Admitting: *Deleted

## 2017-11-09 MED ORDER — GABAPENTIN 300 MG PO CAPS: ORAL_CAPSULE | ORAL | 3 refills | Status: DC

## 2017-12-14 ENCOUNTER — Ambulatory Visit (INDEPENDENT_AMBULATORY_CARE_PROVIDER_SITE_OTHER): Payer: Managed Care, Other (non HMO) | Admitting: Adult Health

## 2017-12-14 ENCOUNTER — Encounter: Payer: Self-pay | Admitting: Adult Health

## 2017-12-14 VITALS — BP 135/82 | HR 77

## 2017-12-14 DIAGNOSIS — G43019 Migraine without aura, intractable, without status migrainosus: Secondary | ICD-10-CM

## 2017-12-14 NOTE — Progress Notes (Signed)
Patient brought in [place] [id number], from [facility], 70 mg prefilled syringes x 2 for injection and injection education. Lot #[id number], exp 05/2019. Instructed patient on how to give self injection. This RN injected Aimovig 70 mg into right mid thigh, subqu. The patient gave herself Aimovig 70 mg injection in left mid thigh, subqu. She tolerated injections well, placed bandaid over sites.

## 2017-12-14 NOTE — Progress Notes (Signed)
PATIENT: Michelle Reilly DOB: 1961/05/31  REASON FOR VISIT: follow up HISTORY FROM: patient  HISTORY OF PRESENT ILLNESS: Today 12/14/17 Michelle Reilly is a 57 year old female with a history of migraine headaches and a previous history of meningioma of the brain.  She returns today for an evaluation.  Her visit today was for Botox injection.  However she reports that she did not want to have Botox any longer.  Dr. Jannifer Franklin prescribed Aimovig she is received the medication.  She would like to start this today if possible.  She states after 3 cycles of Botox she has not noted the benefit in regards to her headaches.  She states that she continues to have 12-15 headache days a month.  She states that she may get 1 headache a week however the last 3-4 days sometimes.  She reports that her headaches always start with a numbness on the left side of the face and then progresses to a full migraine.  She states that she does use Toradol as well as tramadol for her migraine.  She reports that she is also been having episodes of incontinence is been ongoing for a while.  She is unsure if this is related to a herniated disc in the thoracic region.  She also states that she has noticed that she is dropping things when holding it with the left hand.  She reports that she has an appointment next month with Dr. Jannifer Franklin and would like to discuss the symptoms with him. HISTORY Michelle Reilly is a 57 year old female with a history of migraine headaches and meningioma of the brain. She returns today for follow-up. She is currently on Zonegran 150 mg twice a day. She has received one round of Botox injections. She reports that her headache frequency has decreased. She reports that she did not have any headache days in April after injections. She states in May she only had 2 headaches. She reports her headache frequency has picked back up in June but still less than what it was. She is very happy with the injections and would like to  continue those. She reports that her headaches still occur on the right side. She does have light and noise sensitivity as well as nausea. She states receiving Botox in the shoulders is also relieved her fibromyalgia pain. She reports that she recently had a fall. She states that all put her down. She did have x-rays and reportedly she had no fractures. She states that she is very sore. She also states that since her last visit she had a colonoscopy and it was reported that she has an ileus in the small intestines. She returns today for an evaluation.  REVIEW OF SYSTEMS: Out of a complete 14 system review of symptoms, the patient complains only of the following symptoms, and all other reviewed systems are negative.  See HPI  ALLERGIES: Allergies  Allergen Reactions  . Other Other (See Comments)    Other reaction(s): Other Propafol (sedating agent) - causes severe congestion  . Hydrocodone-Acetaminophen Hives  . Oxycodone-Acetaminophen Itching    Other Reaction: itchy face  . Penicillins Hives    .Marland KitchenHas patient had a PCN reaction causing immediate rash, facial/tongue/throat swelling, SOB or lightheadedness with hypotension: No Has patient had a PCN reaction causing severe rash involving mucus membranes or skin necrosis: No Has patient had a PCN reaction that required hospitalization No Has patient had a PCN reaction occurring within the last 10 years: No If all of the above  answers are "NO", then may proceed with Cephalosporin use.   Marland Kitchen Petrolatum-Zinc Oxide Other (See Comments)    Other reaction(s): Vomiting  . Propofol Other (See Comments)    After heart ablation, they told her she was allergic, all she remembers is congestion.  . Topamax [Topiramate] Other (See Comments)    Flu like symptoms  . Doxycycline Rash  . Erythromycin Rash  . Nitrofurantoin Nausea And Vomiting  . Sulfa Antibiotics Nausea And Vomiting    HOME MEDICATIONS: Outpatient Medications Prior to Visit  Medication  Sig Dispense Refill  . acetaminophen (TYLENOL) 500 MG tablet Take 1,000 mg by mouth every 6 (six) hours as needed for mild pain or headache.     . AMITIZA 24 MCG capsule TAKE 1 CAPSULE BY MOUTH 2 TIMES DAILY WITH MEALS FOR 30 DAYS.  3  . buPROPion (WELLBUTRIN XL) 150 MG 24 hr tablet one tablet daily in the morning    . DULoxetine (CYMBALTA) 60 MG capsule Take 120 mg by mouth at bedtime.     Eduard Roux (AIMOVIG 140 DOSE) 70 MG/ML SOAJ Inject 140 mg into the skin every 30 (thirty) days. 1 pen 3  . gabapentin (NEURONTIN) 300 MG capsule One capsule in the morning, 2 in the evening 90 capsule 3  . ketorolac (TORADOL) 10 MG tablet TAKE 1 TABLET (10 MG TOTAL) BY MOUTH EVERY 6 (SIX) HOURS AS NEEDED. 30 tablet 5  . ondansetron (ZOFRAN) 4 MG tablet Take 1 tablet (4 mg total) by mouth every 8 (eight) hours as needed for nausea or vomiting. 30 tablet 2  . polyethylene glycol (MIRALAX / GLYCOLAX) packet Take 17 g by mouth.    . traMADol (ULTRAM) 50 MG tablet Take 100 mg by mouth as needed for moderate pain.     . Vitamin D, Ergocalciferol, (DRISDOL) 50000 UNITS CAPS capsule Take 50,000 Units by mouth once a week.    . zonisamide (ZONEGRAN) 50 MG capsule TAKE 3 CAPSULES (150 MG TOTAL) BY MOUTH 2 (TWO) TIMES DAILY. 180 capsule 6  . predniSONE (DELTASONE) 10 MG tablet Begin taking 6 tablets daily, taper by one tablet every other day until off the medication. 42 tablet 0  . verapamil (VERELAN) 100 MG 24 hr capsule Take 100 mg by mouth at bedtime.      No facility-administered medications prior to visit.     PAST MEDICAL HISTORY: Past Medical History:  Diagnosis Date  . Classical migraine with intractable migraine 07/23/2016  . Depression   . Fibromyalgia   . Meningioma ([place])   . 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 Tachycardia    superior Ventriculat    PAST SURGICAL HISTORY: Past Surgical History:  Procedure Laterality Date  . BRAIN MENINGIOMA EXCISION  05/2008   Gamma knife  procedure 2016  . ENDOMETRIAL ABLATION  8/06  . heart ablation  2/12    FAMILY HISTORY: Family History  Problem Relation Age of Onset  . Diabetes Mother   . Arthritis/Rheumatoid Mother   . Addison's disease Mother   . Anemia Mother   . Migraines Mother   . Cancer - Lung Father   . Metabolic syndrome Brother   . Migraines Brother   . Migraines Brother     SOCIAL HISTORY: Social History   Socioeconomic History  . Marital status: Married    Spouse name: Not on file  . Number of children: 2  . Years of education: college  . Highest education level: Not on file  Social  Needs  . Financial resource strain: Not on file  . Food insecurity - worry: Not on file  . Food insecurity - inability: Not on file  . Transportation needs - medical: Not on file  . Transportation needs - non-medical: Not on file  Occupational History  . Occupation: [employer]  Tobacco Use  . Smoking status: Never Smoker  . Smokeless tobacco: Never Used  Substance and Sexual Activity  . Alcohol use: Yes  . Drug use: No  . Sexual activity: Yes  Other Topics Concern  . Not on file  Social History Narrative  . Not on file      PHYSICAL EXAM  Vitals:   12/14/17 1535  BP: 135/82  Pulse: 77   There is no height or weight on file to calculate BMI.  Generalized: Well developed, in no acute distress   Neurological examination  Mentation: Alert oriented to time, place, history taking. Follows all commands speech and language fluent Cranial nerve II-XII: Pupils were equal round reactive to light. Extraocular movements were full, visual field were full on confrontational test. Facial sensation and strength were normal. Uvula tongue midline. Head turning and shoulder shrug  were normal and symmetric. Motor: The motor testing reveals 5 over 5 strength of all 4 extremities. Good symmetric motor tone is noted throughout.  Sensory: Sensory testing is intact to soft touch on all 4 extremities. No evidence of  extinction is noted.  Coordination: Cerebellar testing reveals good finger-nose-finger and heel-to-shin bilaterally.  Gait and station: Gait is normal. Tandem gait is normal. Romberg is negative. No drift is seen.  Reflexes: Deep tendon reflexes are symmetric and normal bilaterally.   DIAGNOSTIC DATA (LABS, IMAGING, TESTING) - I reviewed patient records, labs, notes, testing and imaging myself where available.  Lab Results  Component Value Date   WBC 5.6 08/31/2016   HGB 13.5 08/31/2016   HCT 40.3 08/31/2016   MCV 92.9 08/31/2016   PLT 287 08/31/2016      Component Value Date/Time   NA 141 08/31/2016 0755   K 4.1 08/31/2016 0755   CL 107 08/31/2016 0755   CO2 29 08/31/2016 0755   GLUCOSE 88 08/31/2016 0755   BUN 11 08/31/2016 0755   CREATININE 0.95 08/31/2016 0755   CALCIUM 9.4 08/31/2016 0755   PROT 6.9 08/31/2016 0755   ALBUMIN 4.0 08/31/2016 0755   AST 17 08/31/2016 0755   ALT 16 08/31/2016 0755   ALKPHOS 90 08/31/2016 0755   BILITOT 0.8 08/31/2016 0755   GFRNONAA >60 08/31/2016 0755   GFRAA >60 08/31/2016 0755      ASSESSMENT AND PLAN 57 y.o. 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 here with:  1.  Migraine headaches  Patient has had 3 injections cycles of Botox however she has not found it beneficial.  She has received Aimovig and would like to be injected today.  I will have my nurse go over the autoinjector with her.  She is advised that if her headache frequency or severity does not improve she should let us know.  She is having new symptoms that she prefers to discuss with Dr. Jannifer Franklin at her visit next month.     Ward Givens, MSN, NP-C 12/14/2017, 4:54 PM [facility] [address] [place], [place] [address] [phone]

## 2017-12-14 NOTE — Progress Notes (Signed)
I have read the note, and I agree with the clinical assessment and plan.  Marcelline Temkin K Sayid Moll   

## 2017-12-23 ENCOUNTER — Telehealth: Payer: Self-pay | Admitting: Adult Health

## 2017-12-23 MED ORDER — PREDNISONE 5 MG PO TABS: ORAL_TABLET | ORAL | 0 refills | Status: DC

## 2017-12-23 NOTE — Telephone Encounter (Signed)
I called the patient.  She is having a severe headache over the last couple days, she has missed work yesterday afternoon and today.  We will send in a prescription for the prednisone Dosepak.  She has just started her Aimovig.  She is on Zonegran, she is having some metallic taste and some tingling or numbness of the tongue on the medication.  A prednisone Dosepak was sent in.

## 2017-12-23 NOTE — Addendum Note (Signed)
Addended by: Kathrynn Ducking on: 12/23/2017 02:03 PM   Modules accepted: Orders

## 2017-12-23 NOTE — Telephone Encounter (Signed)
Pt called she's had migraine for the last 2 days. She left work early yesterday, she is not working today. She is light, sound sensitive, nauseated. Pt is wanting prednisone or something to break the cycle. She said this is the 2nd migraine in a week. She has taken tramadol, ketorolac (TORADOL) 10 MG tablet and gabapentin. Pharmacy: [facility]. Please call to advise

## 2018-01-21 ENCOUNTER — Other Ambulatory Visit: Payer: Self-pay | Admitting: Neurology

## 2018-01-23 ENCOUNTER — Telehealth: Payer: Self-pay | Admitting: Adult Health

## 2018-01-23 DIAGNOSIS — D329 Benign neoplasm of meninges, unspecified: Secondary | ICD-10-CM

## 2018-01-23 MED ORDER — PREDNISONE 5 MG PO TABS: ORAL_TABLET | ORAL | 0 refills | Status: DC

## 2018-01-23 NOTE — Telephone Encounter (Signed)
Pt called stating she has had a non stop migraine since last Monday 01/16/18, also she hasn't been able to sleep fully throughout the night only taking "cat naps". Pt said her whole left side has been week along with this. Pt is aware of her appt on 01/25/18 but is wanting to know if there is anything she can do. Pt has been taking traMADol (ULTRAM) 50 MG tablet then after 8 hours ketorolac (TORADOL) 10 MG tablet

## 2018-01-23 NOTE — Telephone Encounter (Signed)
I called the patient.  The patient has had severe headache for at least 1 week, she is now on Aimovig, we will start a prednisone Dosepak for 6 days, she will be seen in 2 days in the office for revisit.  The patient having some numbness on the side of the lips, she has a history of a prior meningioma, we will recheck MRI of the brain.  The order has been placed.

## 2018-01-23 NOTE — Telephone Encounter (Signed)
I spoke to husband (whom I relayed that needs to be on pts DPR).  He stated that she has had migraine sx since 01-16-18.  Has upper lip/lower lip numbness, metallic taste in mouth, has balance issues when has migraines, and seems to be getting worse.  He is worried that she has recurrence of brain tumor (meningioma) that she has had twice already.  Has head pain L side and R lower face and jaw.  This is different from where she had previous menigioma sx).  He is concerned with her quality of life.  (trying to figure etiology of sx) ? She was asleep when I called, as had been awake all last night.  The prednisone that she had last month did help.  Husband to be at appt on 01-25-18 with Dr. Jannifer Franklin, but asked if anything could be done prior to that.  Please advise.

## 2018-01-25 ENCOUNTER — Encounter: Payer: Self-pay | Admitting: Neurology

## 2018-01-25 ENCOUNTER — Ambulatory Visit (INDEPENDENT_AMBULATORY_CARE_PROVIDER_SITE_OTHER): Payer: Managed Care, Other (non HMO) | Admitting: Neurology

## 2018-01-25 VITALS — BP 130/87 | HR 104 | Ht 61.0 in | Wt 145.0 lb

## 2018-01-25 DIAGNOSIS — G43119 Migraine with aura, intractable, without status migrainosus: Secondary | ICD-10-CM

## 2018-01-25 DIAGNOSIS — Z5181 Encounter for therapeutic drug level monitoring: Secondary | ICD-10-CM

## 2018-01-25 NOTE — Progress Notes (Signed)
Reason for visit: Migraine headache, meningioma  Michelle Reilly is an 57 y.o. female  History of present illness:  Michelle Reilly is a 57 year old right-handed white female with a history of intractable migraine headache.  The patient also has a parafalcine meningioma that is followed through [facility].  She last had MRI evaluation of the brain 1 year ago.  The patient however has had ongoing significant headaches.  The patient was given a trial on Botox without significant benefit, and she was switched to [place].  The patient feels worse on Aimovig, her headaches are quite significant.  The patient is missing a lot of work, she has not been able to go to work for the first 3 days of this week.  She has had a headache for almost 6 days.  She has been placed on prednisone but this has not yet helped her.  She has a lot of nausea, she is not eating well, she is not sleeping well because of the headache.  The patient also has some left-sided symptoms with some numbness of the left face, left arm and leg weakness, droopiness of the left face, drooling.  The patient may also have diarrhea and incontinence of the bowels.  In 2016 she had gamma knife procedure for her meningioma.  The MRI of the brain done 1 year ago showed stability of the meningioma residual.  She is concerned that the meningioma may be getting worse.  MRI of the brain has been ordered but not yet done.  She is on Cymbalta taking 120 mg twice daily.  Past Medical History:  Diagnosis Date  . Classical migraine with intractable migraine 07/23/2016  . Depression   . Fibromyalgia   . Meningioma ([place])   . 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 Tachycardia    superior Ventriculat    Past Surgical History:  Procedure Laterality Date  . BRAIN MENINGIOMA EXCISION  05/2008   Gamma knife procedure 2016  . ENDOMETRIAL ABLATION  8/06  . heart ablation  2/12    Family History  Problem Relation Age of Onset  . Diabetes  Mother   . Arthritis/Rheumatoid Mother   . Addison's disease Mother   . Anemia Mother   . Migraines Mother   . Cancer - Lung Father   . Metabolic syndrome Brother   . Migraines Brother   . Migraines Brother     Social history:  reports that  has never smoked. she has never used smokeless tobacco. She reports that she drinks alcohol. She reports that she does not use drugs.    Allergies  Allergen Reactions  . Other Other (See Comments)    Other reaction(s): Other Propafol (sedating agent) - causes severe congestion  . Hydrocodone-Acetaminophen Hives  . Oxycodone-Acetaminophen Itching    Other Reaction: itchy face  . Penicillins Hives    .Marland KitchenHas patient had a PCN reaction causing immediate rash, facial/tongue/throat swelling, SOB or lightheadedness with hypotension: No Has patient had a PCN reaction causing severe rash involving mucus membranes or skin necrosis: No Has patient had a PCN reaction that required hospitalization No Has patient had a PCN reaction occurring within the last 10 years: No If all of the above answers are "NO", then may proceed with Cephalosporin use.   Marland Kitchen Petrolatum-Zinc Oxide Other (See Comments)    Other reaction(s): Vomiting  . Propofol Other (See Comments)    After heart ablation, they told her she was allergic, all she remembers is congestion.  Marland Kitchen  Topamax [Topiramate] Other (See Comments)    Flu like symptoms  . Doxycycline Rash  . Erythromycin Rash  . Nitrofurantoin Nausea And Vomiting  . Sulfa Antibiotics Nausea And Vomiting    Medications:  Prior to Admission medications   Medication Sig Start Date End Date Taking? Authorizing Provider  acetaminophen (TYLENOL) 500 MG tablet Take 1,000 mg by mouth every 6 (six) hours as needed for mild pain or headache.    Yes [provider]  AMITIZA 24 MCG capsule TAKE 1 CAPSULE BY MOUTH 2 TIMES DAILY WITH MEALS FOR 30 DAYS. 05/14/17  Yes [provider]  buPROPion (WELLBUTRIN XL) 150 MG 24 hr  tablet one tablet daily in the morning 05/11/15  Yes [provider]  DULoxetine (CYMBALTA) 60 MG capsule Take 120 mg by mouth at bedtime.  05/06/14  Yes [provider]  Erenumab-aooe (AIMOVIG 140 DOSE) 70 MG/ML SOAJ Inject 140 mg into the skin every 30 (thirty) days. 09/02/17  Yes Kathrynn Ducking, MD  gabapentin (NEURONTIN) 300 MG capsule One capsule in the morning, 2 in the evening Patient taking differently: 2 in the evening 11/09/17  Yes [place], Jinny Blossom, NP  ketorolac (TORADOL) 10 MG tablet TAKE 1 TABLET (10 MG TOTAL) BY MOUTH EVERY 6 (SIX) HOURS AS NEEDED. 01/23/18  Yes Kathrynn Ducking, MD  ondansetron (ZOFRAN) 4 MG tablet TAKE 1 TABLET (4 MG TOTAL) BY MOUTH EVERY 8 (EIGHT) HOURS AS NEEDED FOR NAUSEA OR VOMITING. 01/23/18  Yes Kathrynn Ducking, MD  polyethylene glycol (MIRALAX / GLYCOLAX) packet Take 17 g by mouth.   Yes [provider]  predniSONE (DELTASONE) 5 MG tablet Begin taking 6 tablets daily, taper by one tablet daily until off the medication. 01/23/18  Yes Kathrynn Ducking, MD  traMADol (ULTRAM) 50 MG tablet Take 100 mg by mouth as needed for moderate pain.  10/07/10  Yes [provider]  Vitamin D, Ergocalciferol, (DRISDOL) 50000 UNITS CAPS capsule Take 50,000 Units by mouth once a week. 05/06/14  Yes [provider]  zonisamide (ZONEGRAN) 50 MG capsule TAKE 3 CAPSULES (150 MG TOTAL) BY MOUTH 2 (TWO) TIMES DAILY. 06/27/17  Yes Kathrynn Ducking, MD  verapamil (VERELAN) 100 MG 24 hr capsule Take 100 mg by mouth at bedtime.  02/27/16 02/21/17  [provider]    ROS:  Out of a complete 14 system review of symptoms, the patient complains only of the following symptoms, and all other reviewed systems are negative.  Light sensitivity, blurred vision Excessive thirst Constipation, diarrhea, incontinence of the bowels Memory loss, dizziness, headache, numbness, weakness, tremors Agitation  Blood pressure 130/87, pulse (!) 104, height  5\' 1"  (1.549 m), weight 145 lb (65.8 kg), last menstrual period 06/11/2014.  Physical Exam  General: The patient is alert and cooperative at the time of the examination.  Skin: No significant peripheral edema is noted.   Neurologic Exam  Mental status: The patient is alert and oriented x 3 at the time of the examination. The patient has apparent normal recent and remote memory, with an apparently normal attention span and concentration ability.   Cranial nerves: Facial symmetry is present. Speech is normal, no aphasia or dysarthria is noted. Extraocular movements are full. Visual fields are full.  Motor: The patient has good strength in all 4 extremities.  Sensory examination: Soft touch sensation is symmetric on the face, arms, and legs.  Coordination: The patient has good finger-nose-finger and heel-to-shin bilaterally.  Gait and station: The patient has a  normal gait. Tandem gait is normal. Romberg is negative. No drift is seen.  Reflexes: Deep tendon reflexes are symmetric.   Assessment/Plan:  1.  Intractable migraine headache  2.  Parafalcine meningioma  The patient is having focal symptoms with her migraine, she is having some left-sided numbness and weakness, memory disturbance, difficulty with sleeping, and significant nausea.  The patient will be sent for a Depacon injection today.  She is on Cymbalta, and some people on this may significantly worsened migraine headache.  We will consider a taper off of Cymbalta by 60 mg every 2 weeks until off the drug.  The patient will need to watch out for a withdrawal syndrome from this medication.  The patient will continue Aimovig for now, but we may go back to Botox in the future.  She will follow-up in 3 months.  Jill Alexanders MD 01/25/2018 9:44 AM  [facility] [address] [place], [place] [address]  Phone [phone] Fax [phone]

## 2018-01-25 NOTE — Patient Instructions (Signed)
   We will taper off of the Cymbalta 60 mg tablet by one tablet every 2 weeks until off of the medication.

## 2018-01-26 LAB — COMPREHENSIVE METABOLIC PANEL
A/G RATIO: 2 (ref 1.2–2.2)
ALT: 12 IU/L (ref 0–32)
AST: 12 IU/L (ref 0–40)
Albumin: 4.5 g/dL (ref 3.5–5.5)
Alkaline Phosphatase: 114 IU/L (ref 39–117)
BUN / CREAT RATIO: 15 (ref 9–23)
BUN: 14 mg/dL (ref 6–24)
CHLORIDE: 105 mmol/L (ref 96–106)
CO2: 23 mmol/L (ref 20–29)
Calcium: 10.1 mg/dL (ref 8.7–10.2)
Creatinine, Ser: 0.91 mg/dL (ref 0.57–1.00)
GFR calc non Af Amer: 72 mL/min/{1.73_m2} (ref >59)
GFR, EST AFRICAN AMERICAN: 83 mL/min/{1.73_m2} (ref >59)
GLUCOSE: 118 mg/dL — AB (ref 65–99)
Globulin, Total: 2.3 g/dL (ref 1.5–4.5)
Potassium: 5.2 mmol/L (ref 3.5–5.2)
Sodium: 145 mmol/L — ABNORMAL HIGH (ref 134–144)
TOTAL PROTEIN: 6.8 g/dL (ref 6.0–8.5)

## 2018-02-04 ENCOUNTER — Ambulatory Visit
Admission: RE | Admit: 2018-02-04 | Discharge: 2018-02-04 | Disposition: A | Discharge status: Home / Self Care | Payer: Managed Care, Other (non HMO) | Source: Ambulatory Visit | Attending: Neurology | Admitting: Neurology

## 2018-02-04 DIAGNOSIS — D329 Benign neoplasm of meninges, unspecified: Secondary | ICD-10-CM | POA: Diagnosis not present

## 2018-02-04 MED ORDER — GADOBENATE DIMEGLUMINE 529 MG/ML IV SOLN
13.0000 mL | Freq: Once | INTRAVENOUS | Status: AC | PRN
  Administered 2018-02-04: 13 mL via INTRAVENOUS

## 2018-02-05 ENCOUNTER — Telehealth: Payer: Self-pay | Admitting: Neurology

## 2018-02-05 NOTE — Telephone Encounter (Signed)
I called the patient.  The MRI of the brain is unchanged from one year ago, she does have a very small meningioma, no enlargement is seen.  Nothing that would be causing the left-sided numbness is seen.  The patient still has frequent migraine.  She may call tomorrow if her headache continues, she may need a Depacon injection which did seem to help.  We may need to pursue Botox therapy soon.   MRI brain 12/08/17:  IMPRESSION:  This MRI of the brain with and without contrast shows the following: 1.    Small residual or recurrent right parafalxine meningioma measuring 9 mm in maximum and unchanged in appearance when compared to the 01/11/2017 MRI.  There is a small focus of encephalomalacia in the adjacent brain. 2.    The brain is otherwise normal and there are no acute findings

## 2018-02-15 ENCOUNTER — Other Ambulatory Visit: Payer: Self-pay | Admitting: *Deleted

## 2018-02-15 MED ORDER — ZONISAMIDE 50 MG PO CAPS: 150.0000 mg | ORAL_CAPSULE | Freq: Two times a day (BID) | ORAL | 6 refills | Status: DC

## 2018-02-17 ENCOUNTER — Telehealth: Payer: Self-pay | Admitting: Neurology

## 2018-02-17 ENCOUNTER — Encounter: Payer: Self-pay | Admitting: Neurology

## 2018-02-17 MED ORDER — DULOXETINE HCL 30 MG PO CPEP: 30.0000 mg | ORAL_CAPSULE | Freq: Every day | ORAL | 1 refills | Status: DC

## 2018-02-17 NOTE — Telephone Encounter (Signed)
Pt requesting a call back to discuss other options(for Cymbalta) and getting an infusion. Pt stating since Monday 3/18 she has had a migraine to the point where she has only been able to go to work one day this week. Pt also stating she has been off of Cymbalta for about a week and a half now and states it messing with her balance and making her feel overall exhausted

## 2018-02-17 NOTE — Telephone Encounter (Signed)
I called the patient.  The patient is having severe headache today.  She has not been able to work all week.  She is having withdrawal symptoms from the Cymbalta, I will call him to 30 mg capsule taking 1 tablet daily for the next 3 weeks and then try to stop it again.  We discussed taking a 42-month leave of absence from work, the patient may go on short-term disability.  She will send in the forms.  She will need a note for work.

## 2018-02-20 DIAGNOSIS — Z0289 Encounter for other administrative examinations: Secondary | ICD-10-CM

## 2018-02-23 ENCOUNTER — Telehealth: Payer: Self-pay | Admitting: *Deleted

## 2018-02-23 NOTE — Telephone Encounter (Signed)
Pt [employer] form faxed today to [phone]

## 2018-02-23 NOTE — Telephone Encounter (Signed)
Gave completed/signed FMLA from [employer] back to medical records to process for pt.

## 2018-03-07 ENCOUNTER — Telehealth: Payer: Self-pay | Admitting: Neurology

## 2018-03-07 NOTE — Telephone Encounter (Signed)
Called pt, no answer but LVM on 4/9 to make pt aware we have received her FMLA form. Also said in message that there is a $50 fee before form can be filled out. Informed pt that she can come to our office or pay it over the phone.

## 2018-03-18 ENCOUNTER — Other Ambulatory Visit: Payer: Self-pay | Admitting: Adult Health

## 2018-03-23 ENCOUNTER — Telehealth: Payer: Self-pay | Admitting: Adult Health

## 2018-03-23 ENCOUNTER — Encounter: Payer: Self-pay | Admitting: Adult Health

## 2018-03-23 ENCOUNTER — Ambulatory Visit (INDEPENDENT_AMBULATORY_CARE_PROVIDER_SITE_OTHER): Payer: Managed Care, Other (non HMO) | Admitting: Adult Health

## 2018-03-23 VITALS — BP 131/79 | HR 90 | Ht 61.0 in | Wt 141.4 lb

## 2018-03-23 DIAGNOSIS — G43719 Chronic migraine without aura, intractable, without status migrainosus: Secondary | ICD-10-CM

## 2018-03-23 NOTE — Progress Notes (Signed)
I have read the note, and I agree with the clinical assessment and plan.  Michelle Reilly K Michelle Reilly   

## 2018-03-23 NOTE — Patient Instructions (Addendum)
Your Plan:  We will restart Botox if approved If not we will try Ajovy If your symptoms worsen or you develop new symptoms please let us know.   Thank you for coming to see [place] at [facility]. I hope we have been able to provide you high quality care today.  You may receive a patient satisfaction survey over the next few weeks. We would appreciate your feedback and comments so that we may continue to improve ourselves and the health of our patients.

## 2018-03-23 NOTE — Progress Notes (Signed)
PATIENT: Michelle Reilly DOB: 23-Nov-1961  REASON FOR VISIT: follow up HISTORY FROM: patient  HISTORY OF PRESENT ILLNESS: Today 03/23/18 Michelle Reilly is a 57 year old female with a history of intractable migraine headaches.  She returns today for follow-up.  The patient did 2 injections of Aimovig but this was not beneficial for her headaches.  She would like to switch back to botox.  She states that she is having a daily headache accompanied with nausea and vomiting as well as balance changes.  She states that she has a probably 3-4 severe headaches a week.  She states that with the headaches she does have strokelike symptoms.  Reports that the left side of face is asymmetrical and has weakness in the left arm and leg.  She states that these headaches can last  for an entire day.  She feels that she got better benefit with Botox and would like to restart this medication if possible.  She returns today for an evaluation.   HISTORY Michelle Reilly is a 57 year old right-handed white female with a history of intractable migraine headache.  The patient also has a parafalcine meningioma that is followed through [facility].  She last had MRI evaluation of the brain 1 year ago.  The patient however has had ongoing significant headaches.  The patient was given a trial on Botox without significant benefit, and she was switched to [place].  The patient feels worse on Aimovig, her headaches are quite significant.  The patient is missing a lot of work, she has not been able to go to work for the first 3 days of this week.  She has had a headache for almost 6 days.  She has been placed on prednisone but this has not yet helped her.  She has a lot of nausea, she is not eating well, she is not sleeping well because of the headache.  The patient also has some left-sided symptoms with some numbness of the left face, left arm and leg weakness, droopiness of the left face, drooling.  The patient may also have diarrhea and  incontinence of the bowels.  In 2016 she had gamma knife procedure for her meningioma.  The MRI of the brain done 1 year ago showed stability of the meningioma residual.  She is concerned that the meningioma may be getting worse.  MRI of the brain has been ordered but not yet done.  She is on Cymbalta taking 120 mg twice daily.   REVIEW OF SYSTEMS: Out of a complete 14 system review of symptoms, the patient complains only of the following symptoms, and all other reviewed systems are negative.  See HPI ALLERGIES: Allergies  Allergen Reactions  . Other Other (See Comments)    Other reaction(s): Other Propafol (sedating agent) - causes severe congestion  . Hydrocodone-Acetaminophen Hives  . Oxycodone-Acetaminophen Itching    Other Reaction: itchy face  . Penicillins Hives    .Marland KitchenHas patient had a PCN reaction causing immediate rash, facial/tongue/throat swelling, SOB or lightheadedness with hypotension: No Has patient had a PCN reaction causing severe rash involving mucus membranes or skin necrosis: No Has patient had a PCN reaction that required hospitalization No Has patient had a PCN reaction occurring within the last 10 years: No If all of the above answers are "NO", then may proceed with Cephalosporin use.   Marland Kitchen Petrolatum-Zinc Oxide Other (See Comments)    Other reaction(s): Vomiting  . Propofol Other (See Comments)    After heart ablation, they told her  she was allergic, all she remembers is congestion.  . Topamax [Topiramate] Other (See Comments)    Flu like symptoms  . Doxycycline Rash  . Erythromycin Rash  . Nitrofurantoin Nausea And Vomiting  . Sulfa Antibiotics Nausea And Vomiting    HOME MEDICATIONS: Outpatient Medications Prior to Visit  Medication Sig Dispense Refill  . acetaminophen (TYLENOL) 500 MG tablet Take 1,000 mg by mouth every 6 (six) hours as needed for mild pain or headache.     . AMITIZA 24 MCG capsule TAKE 1 CAPSULE BY MOUTH 2 TIMES DAILY WITH MEALS FOR 30  DAYS.  3  . buPROPion (WELLBUTRIN XL) 150 MG 24 hr tablet one tablet daily in the morning    . gabapentin (NEURONTIN) 300 MG capsule TAKE ONE CAPSULE IN THE MORNING, 2 IN THE EVENING 90 capsule 1  . ketorolac (TORADOL) 10 MG tablet TAKE 1 TABLET (10 MG TOTAL) BY MOUTH EVERY 6 (SIX) HOURS AS NEEDED. 30 tablet 4  . ondansetron (ZOFRAN) 4 MG tablet TAKE 1 TABLET (4 MG TOTAL) BY MOUTH EVERY 8 (EIGHT) HOURS AS NEEDED FOR NAUSEA OR VOMITING. 30 tablet 1  . polyethylene glycol (MIRALAX / GLYCOLAX) packet Take 17 g by mouth.    . traMADol (ULTRAM) 50 MG tablet Take 100 mg by mouth as needed for moderate pain.     . Vitamin D, Ergocalciferol, (DRISDOL) 50000 UNITS CAPS capsule Take 50,000 Units by mouth once a week.    . verapamil (VERELAN) 100 MG 24 hr capsule Take 100 mg by mouth at bedtime.     . DULoxetine (CYMBALTA) 30 MG capsule Take 1 capsule (30 mg total) by mouth daily. 30 capsule 1  . Erenumab-aooe (AIMOVIG 140 DOSE) 70 MG/ML SOAJ Inject 140 mg into the skin every 30 (thirty) days. 1 pen 3  . predniSONE (DELTASONE) 5 MG tablet Begin taking 6 tablets daily, taper by one tablet daily until off the medication. 21 tablet 0  . zonisamide (ZONEGRAN) 50 MG capsule Take 3 capsules (150 mg total) by mouth 2 (two) times daily. 180 capsule 6   No facility-administered medications prior to visit.     PAST MEDICAL HISTORY: Past Medical History:  Diagnosis Date  . Classical migraine with intractable migraine 07/23/2016  . Depression   . Fibromyalgia   . Meningioma ([place])   . 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 Tachycardia    superior Ventriculat    PAST SURGICAL HISTORY: Past Surgical History:  Procedure Laterality Date  . BRAIN MENINGIOMA EXCISION  05/2008   Gamma knife procedure 2016  . ENDOMETRIAL ABLATION  8/06  . heart ablation  2/12    FAMILY HISTORY: Family History  Problem Relation Age of Onset  . Diabetes Mother   . Arthritis/Rheumatoid Mother   . Addison's  disease Mother   . Anemia Mother   . Migraines Mother   . Cancer - Lung Father   . Metabolic syndrome Brother   . Migraines Brother   . Migraines Brother     SOCIAL HISTORY: Social History   Socioeconomic History  . Marital status: Married    Spouse name: Not on file  . Number of children: 2  . Years of education: college  . Highest education level: Not on file  Occupational History  . Occupation: [employer]  Social Needs  . Financial resource strain: Not on file  . Food insecurity:    Worry: Not on file    Inability: Not on file  . Transportation  needs:    Medical: Not on file    Non-medical: Not on file  Tobacco Use  . Smoking status: Never Smoker  . Smokeless tobacco: Never Used  Substance and Sexual Activity  . Alcohol use: Yes  . Drug use: No  . Sexual activity: Yes  Lifestyle  . Physical activity:    Days per week: Not on file    Minutes per session: Not on file  . Stress: Not on file  Relationships  . Social connections:    Talks on phone: Not on file    Gets together: Not on file    Attends religious service: Not on file    Active member of club or organization: Not on file    Attends meetings of clubs or organizations: Not on file    Relationship status: Not on file  . Intimate partner violence:    Fear of current or ex partner: Not on file    Emotionally abused: Not on file    Physically abused: Not on file    Forced sexual activity: Not on file  Other Topics Concern  . Not on file  Social History Narrative  . Not on file      PHYSICAL EXAM  Vitals:   03/23/18 0903  BP: 131/79  Pulse: 90  Weight: 141 lb 6.4 oz (64.1 kg)  Height: 5\' 1"  (1.549 m)   Body mass index is 26.72 kg/m.  Generalized: Well developed, in no acute distress   Neurological examination  Mentation: Alert oriented to time, place, history taking. Follows all commands speech and language fluent Cranial nerve II-XII: Pupils were equal round reactive to light.  Extraocular movements were full, visual field were full on confrontational test. Facial sensation and strength were normal. Uvula tongue midline. Head turning and shoulder shrug  were normal and symmetric. Motor: The motor testing reveals 5 over 5 strength of all 4 extremities. Good symmetric motor tone is noted throughout.  Sensory: Sensory testing is intact to soft touch on all 4 extremities. No evidence of extinction is noted.  Coordination: Cerebellar testing reveals good finger-nose-finger and heel-to-shin bilaterally.  Gait and station: Gait is normal. Tandem gait not attempted. Romberg is negative. No drift is seen.  Reflexes: Deep tendon reflexes are symmetric and normal bilaterally.   DIAGNOSTIC DATA (LABS, IMAGING, TESTING) - I reviewed patient records, labs, notes, testing and imaging myself where available.  Lab Results  Component Value Date   WBC 5.6 08/31/2016   HGB 13.5 08/31/2016   HCT 40.3 08/31/2016   MCV 92.9 08/31/2016   PLT 287 08/31/2016      Component Value Date/Time   NA 145 (H) 01/25/2018 1011   K 5.2 01/25/2018 1011   CL 105 01/25/2018 1011   CO2 23 01/25/2018 1011   GLUCOSE 118 (H) 01/25/2018 1011   GLUCOSE 88 08/31/2016 0755   BUN 14 01/25/2018 1011   CREATININE 0.91 01/25/2018 1011   CALCIUM 10.1 01/25/2018 1011   PROT 6.8 01/25/2018 1011   ALBUMIN 4.5 01/25/2018 1011   AST 12 01/25/2018 1011   ALT 12 01/25/2018 1011   ALKPHOS 114 01/25/2018 1011   BILITOT <0.2 01/25/2018 1011   GFRNONAA 72 01/25/2018 1011   GFRAA 83 01/25/2018 1011     ASSESSMENT AND PLAN 57 y.o. 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 here with :  1.  Intractable  migraine headaches  The patient migraines did not improve on Aimovig.  We will restart Botox- if this is approved by her insurance.  If  this is not improved we may consider another injectable such as Ajovy.  Patient voiced understanding.  She will follow-up in 6 months or sooner if needed   Ward Givens, MSN, NP-C 03/23/2018, 9:59 AM [facility] [address], [place],  [address] [phone]

## 2018-03-23 NOTE — Telephone Encounter (Signed)
Paperwork for Botox Josem Kaufmann has been submitted and laid on [employer] for signature from [place] NP.

## 2018-03-24 NOTE — Telephone Encounter (Signed)
Received FMLA form from [employer] for patient. Also received FMLA form from [place] for husband. Forms completed, put on Megan NP's desk for review, signatures. Forms will need to be faxed before mailing a copy to patient.

## 2018-03-27 DIAGNOSIS — Z0289 Encounter for other administrative examinations: Secondary | ICD-10-CM

## 2018-03-27 NOTE — Telephone Encounter (Signed)
The disability papers was received on 4/25 Dr. Jannifer Franklin was out of the office.  He will return to the office tomorrow and this can be reviewed and signed.  In regards to Botox Andee Poles  has submitted paperwork we are waiting for authorization from insurance company.

## 2018-03-27 NOTE — Telephone Encounter (Addendum)
Called patient who stated she had discussed with NP about extending her time off. She stated she received e mail from [facility] today stating they need the decision by tonight. This RN advised papers are on NP's desk for FMLA and that this office has policy of 5-05 days to complete paperwork.  Patient stated it is not FMLA but extension of her short term disability. She stated she needs another 2-4 weeks off.  Patient then asked if her Botox was approved or was she going to be prescribed an injection. This RN advised will route to [facility].

## 2018-03-27 NOTE — Telephone Encounter (Signed)
Pt called she is wanting to know the status of the forms. Pt said extension needs to be sent back by tonight at midnight. Please call to advise

## 2018-03-27 NOTE — Telephone Encounter (Signed)
Received signed forms this morning. Forms and clinicals have been faxed over to [facility].

## 2018-03-27 NOTE — Telephone Encounter (Addendum)
LVM advising patient that Dr Jannifer Franklin was out of the office last Fri and today, returns tomorrow. He must complete her disability forms; the NP cannot complete, sign disability.  Advised she call [employer] and ask for an extension. Advised her the papers are ready for Dr Jannifer Franklin to complete and sign. Left office number.  Paperwork given to [employer].

## 2018-03-27 NOTE — Telephone Encounter (Signed)
Pt called about status of botox- she is aware it can take awhile for approval

## 2018-03-29 ENCOUNTER — Telehealth: Payer: Self-pay | Admitting: *Deleted

## 2018-03-29 NOTE — Telephone Encounter (Signed)
Pt [employer] form and [employer] form faxed on 03/29/18.

## 2018-03-29 NOTE — Telephone Encounter (Signed)
Gave completed/signed paperwork back to medical records to process for patient.

## 2018-03-30 NOTE — Telephone Encounter (Signed)
I called patient back. She stated she received message from [place] that they are going to approve her to be out of work until 04/30/18 which at that time they would like a re-evaluation as to how she is doing and if she can return to work. She feels this should bbe extended. She is requesting f/u because they told her she would need one. I advised she was just seen by MM,NP on 03/23/18. She may not need to come back in for OV. I will ask Dr. Jannifer Franklin to clarify.  She also is requesting what medication she is going to try next. Per MM,NP last office note, they were going to try Botox. Appears authorization is pending with [employer]. Once authorized, we can get her scheduled. I relayed this to her. She verbalized understanding.   I asked if they provided her with a return to work form or not. She advised she could not find one on the website. I requested she contact [employer] to see if they could provide her one. If so, they could fax to [phone], Floyde Parkins

## 2018-03-30 NOTE — Telephone Encounter (Signed)
Pt called to advise [employer] LVM that extension on ly accepting until 04/30/18 and only allowing time to wait to see if the medication works. She is wanting to know what medication she will take next.  Pt also said she will need to make an appt bc Dr Jannifer Franklin will need to re-eval as to whether she will continue on short term or be released to return to work. Please call to advise at [phone]

## 2018-03-30 NOTE — Telephone Encounter (Signed)
err

## 2018-03-30 NOTE — Telephone Encounter (Signed)
The patient was to pull out of work for about 3 months until 20 May 2018.  I suspect her headaches will be continuing, she may have to go out of work.   I called the patient.  I left a message.  We may try another type of medication for the headaches but while she is waiting to get back on the Botox.  The patient could potentially be tried on propranolol if she is not on verapamil currently, we also may consider use of Depakote.  The patient will call me back if she is amenable to going on medication.

## 2018-03-31 MED ORDER — DIVALPROEX SODIUM 500 MG PO DR TAB: DELAYED_RELEASE_TABLET | ORAL | 3 refills | Status: DC

## 2018-03-31 MED ORDER — ZONISAMIDE 50 MG PO CAPS: 150.0000 mg | ORAL_CAPSULE | Freq: Two times a day (BID) | ORAL | Status: DC

## 2018-03-31 NOTE — Addendum Note (Signed)
Addended by: Kathrynn Ducking on: 03/31/2018 01:08 PM   Modules accepted: Orders

## 2018-03-31 NOTE — Telephone Encounter (Signed)
Pt returned Dr Jannifer Franklin call. She is taking verapamil 100mg  2 tabs at bedtime. Please call to advise

## 2018-03-31 NOTE — Telephone Encounter (Signed)
I called the patient.  The patient did get benefit from Depacon injections previously, we will get her on Depakote to see if this helps, even though Zonegran was taken off the medication list she claims that she is still taking this 150 mg twice daily.  I will re-added to the list.  We are working on getting Botox injections.  Her insurance company will not allow her disability to be extended past April 30, 2018.

## 2018-04-10 ENCOUNTER — Telehealth: Payer: Self-pay | Admitting: Adult Health

## 2018-04-10 NOTE — Telephone Encounter (Signed)
Pt stating that since starting divalproex (DEPAKOTE) 500 MG DR tablet she has had increasing lack of appetite, exteremly weakness, along with migraines. Please call to advise.

## 2018-04-10 NOTE — Telephone Encounter (Signed)
Tell her to stop Depakote and see if symptoms resolve. Will not start anything new right now. Hopefully we can do botox injections within the next 1-2 weeks.

## 2018-04-10 NOTE — Telephone Encounter (Signed)
I called and spoke with the patient to make her aware that she has been approved for her botox.

## 2018-04-10 NOTE — Telephone Encounter (Signed)
Spoke with patient and advised she stop taking Depakote, let side effects subside. Advised her NP will not start any new medications at this time. Advised her that hopefully she will be able to have Botox with in 1-2 weeks. She will call back with update in a few days. She verbalized understanding, appreciation of call.

## 2018-04-10 NOTE — Telephone Encounter (Signed)
[id number]  (4 units 03/29/2019).

## 2018-04-13 MED ORDER — GABAPENTIN 300 MG PO CAPS: 900.0000 mg | ORAL_CAPSULE | Freq: Every day | ORAL | 1 refills | Status: DC

## 2018-04-13 NOTE — Telephone Encounter (Signed)
I called patient.  She states that she continues to have a dull headache.  She reports that the side effects she was experiencing is slowly subsiding.  She states that Depakote also messed up her sleep patterns.  She is having a difficult time sleeping at night.  She reports that with the headache she is also having shock-like sensation in the head.  She is currently taking gabapentin 600 mg at bedtime.  Advised that the side effects of Depakote should resolve.  We could increase gabapentin to 900 mg at bedtime as it may help with her headache as well as sleep.  She is amenable to this plan.

## 2018-04-13 NOTE — Telephone Encounter (Signed)
Pt called stating she has been off of Depakote for 3 days now. Pts s/e have dulled down but she still has a slight headache, shocking sensation and her sleep pattern has not gone to normal. Pt states she had be unable to fall asleep until 5am. Please call to advise

## 2018-04-13 NOTE — Addendum Note (Signed)
Addended by: Trudie Buckler on: 04/13/2018 04:56 PM   Modules accepted: Orders

## 2018-04-17 NOTE — Telephone Encounter (Signed)
Can you call her back to reschedule. She must have not gotten your call.

## 2018-04-17 NOTE — Telephone Encounter (Signed)
Pt has called back to report to NP [facility] about how she did over the weekend.  Pt called to inform that Saturday she had a bad migraine the required her to ice it down.  Pt said it took a while to get over.  Pt states her sleep pattern is slowly coming around and the 900 mg of the gabapentin does help.  Pt states she goes to bed around 12 midnight and is up daily around 9 or 10.  Pt also states she would like to hear about where things stand about her Botox.  Please call

## 2018-04-17 NOTE — Telephone Encounter (Signed)
Patient has been approved by insurance, approvals are in previous phone note, I called the patient to schedule her but she did not answer so I left a VM asking her to call me back. I believe we spoke about her last week and you approved her to be schedule in an admin spot at 10am. I am aiming to schedule her the first week of June when she returns my call.

## 2018-04-17 NOTE — Telephone Encounter (Signed)
Michelle Reilly- where are we with her botox?

## 2018-04-18 NOTE — Telephone Encounter (Signed)
Called to speak with patient, husband, Tommi Rumps answered, on [place]. He stated she was sleeping. This RN gave him Botox appointment information, advised she arrive 20-30 minutes early. He then stated Cymbalta is helping some., but she continues to have migraines. He stated she isn't sure she can go back to work. This RN reviewed note from Dr Jannifer Franklin stating her insurance won;t allow disability beyond June 2nd. This RN advised that if she thinks she needs more time off work, she needs to talk with  her supervisor and call her insurance to see what her next steps need to be. Husband verbalized understanding, appreciation.

## 2018-04-18 NOTE — Telephone Encounter (Signed)
Patient is going to come in on June 5th at 10:00am. I do not have access to override Megan's schedule, Verneita Griffes, could you schedule?   Terrence Dupont, she would like to speak with you regarding other issues she is still having and what she is supposed to do about going back to work?

## 2018-04-26 ENCOUNTER — Ambulatory Visit: Payer: Managed Care, Other (non HMO) | Admitting: Neurology

## 2018-05-03 ENCOUNTER — Ambulatory Visit (INDEPENDENT_AMBULATORY_CARE_PROVIDER_SITE_OTHER): Payer: Managed Care, Other (non HMO) | Admitting: Adult Health

## 2018-05-03 ENCOUNTER — Encounter: Payer: Self-pay | Admitting: Adult Health

## 2018-05-03 VITALS — BP 127/85 | HR 89 | Ht 61.0 in | Wt 139.8 lb

## 2018-05-03 DIAGNOSIS — G43719 Chronic migraine without aura, intractable, without status migrainosus: Secondary | ICD-10-CM | POA: Diagnosis not present

## 2018-05-03 NOTE — Progress Notes (Signed)
BOTOX LOT [id number] exp 10/21  (x 2) [employer]. sy

## 2018-05-03 NOTE — Progress Notes (Signed)
BOTOX PROCEDURE NOTE FOR MIGRAINE HEADACHE    Contraindications and precautions discussed with patient(above). Aseptic procedure was observed and patient tolerated procedure. Procedure performed by Ward Givens  The condition has existed for more than 6 months, and pt does not have a diagnosis of ALS, Myasthenia Gravis or Lambert-Eaton Syndrome.  Risks and benefits of injections discussed and pt agrees to proceed with the procedure.  Written consent obtained and signed today.  These injections are medically necessary. Pt  receives good benefits from these injections. These injections do not cause sedations or hallucinations which the oral therapies may cause.  Indication/Diagnosis: chronic migraine BOTOX(J0585) injection was performed according to protocol by [employer]. 200 units of BOTOX was dissolved into 4 cc NS.   NDC: [address]-51 Lot [id number] EXP 08/2020  Description of procedure:  The patient was placed in a sitting position. The standard protocol was used for Botox as follows, with 5 units of Botox injected at each site:   -Procerus muscle, midline injection  -Corrugator muscle, bilateral injection  -Frontalis muscle, bilateral injection, with 2 sites each side, medial injection was performed in the upper one third of the frontalis muscle, in the region vertical from the medial inferior edge of the superior orbital rim. The lateral injection was again in the upper one third of the forehead vertically above the lateral limbus of the cornea, 1.5 cm lateral to the medial injection site.  -Temporalis muscle injection, 4 sites, bilaterally. The first injection was 3 cm above the tragus of the ear, second injection site was 1.5 cm to 3 cm up from the first injection site in line with the tragus of the ear. The third injection site was 1.5-3 cm forward between the first 2 injection sites. The fourth injection site was 1.5 cm posterior to the second injection site.  -Occipitalis  muscle injection, 3 sites, bilaterally. The first injection was done one half way between the occipital protuberance and the tip of the mastoid process behind the ear. The second injection site was done lateral and superior to the first, 1 fingerbreadth from the first injection. The third injection site was 1 fingerbreadth superiorly and medially from the first injection site.  -Cervical paraspinal muscle injection, 2 sites, bilateral knee first injection site was 1 cm from the midline of the cervical spine, 3 cm inferior to the lower border of the occipital protuberance. The second injection site was 1.5 cm superiorly and laterally to the first injection site.  -Trapezius muscle injection was performed at 3 sites, bilaterally. The first injection site was in the upper trapezius muscle halfway between the inflection point of the neck, and the acromion. The second injection site was one half way between the acromion and the first injection site. The third injection was done between the first injection site and the inflection point of the neck.   Will return for repeat injection in 3 months.   A 200 unit sof Botox was used, 155 units were injected, the rest of the Botox was wasted. The patient tolerated the procedure well, there were no complications of the above procedure.  During the procedure patient is concerned about ongoing memory issues.  He states that they have been present over the last year but seems to be worse in the last several months.  Reports that she has trouble with word finding.  She is unsure if this is related to her migraine but is concerned.  I will have the patient come in in the next 2 to  3 weeks for a formal memory evaluation.  Ward Givens, MSN, NP-C 05/03/2018, 10:45 AM [facility] [address] [place], [place] [address] [phone]

## 2018-05-15 ENCOUNTER — Ambulatory Visit (INDEPENDENT_AMBULATORY_CARE_PROVIDER_SITE_OTHER): Payer: Managed Care, Other (non HMO) | Admitting: Adult Health

## 2018-05-15 ENCOUNTER — Encounter: Payer: Self-pay | Admitting: *Deleted

## 2018-05-15 ENCOUNTER — Encounter: Payer: Self-pay | Admitting: Adult Health

## 2018-05-15 VITALS — BP 123/83 | HR 75 | Ht 61.0 in | Wt 142.0 lb

## 2018-05-15 DIAGNOSIS — G43719 Chronic migraine without aura, intractable, without status migrainosus: Secondary | ICD-10-CM

## 2018-05-15 DIAGNOSIS — R413 Other amnesia: Secondary | ICD-10-CM | POA: Diagnosis not present

## 2018-05-15 MED ORDER — KETOROLAC TROMETHAMINE 10 MG PO TABS: 10.0000 mg | ORAL_TABLET | Freq: Four times a day (QID) | ORAL | 3 refills | Status: AC | PRN

## 2018-05-15 NOTE — Patient Instructions (Signed)
Your Plan:  Continue toradol but should not take daily Blood work today Neuropsychology eval  Ordered If your symptoms worsen or you develop new symptoms please let us know.       Thank you for coming to see [place] at [facility]. I hope we have been able to provide you high quality care today.  You may receive a patient satisfaction survey over the next few weeks. We would appreciate your feedback and comments so that we may continue to improve ourselves and the health of our patients.

## 2018-05-15 NOTE — Progress Notes (Signed)
PATIENT: Michelle Reilly DOB: [birth year]  REASON FOR VISIT: follow up HISTORY FROM: patient  HISTORY OF PRESENT ILLNESS: Today 05/15/18 Michelle Reilly is a 57 year old female with a history of intractable migraine headaches.  She returns today for follow-up.  She reports that she has been having trouble with her memory.  This is been a chronic problem since 2017.  She states that she began to notice memory issues after gamma knife treatment.  She did see a neuropsychologist at [facility] who did not see any evidence of memory disturbance.  The patient states that in the last couple months her memory has gotten worse.  She states that she forgets things that she is told.  She forgets people's names.  He tends to forget what day of the week it is.  She lives at home with her husband.  She is able to complete all ADLs independently.  She does complete her own finances but reports that it takes her a long time.  Her husband does most of the cooking.  She states that in the past she used to cook more.  She does report trouble sleeping.  She manages her own medications.  She manages her own appointments but she has to write them down.  She does not operate a motor vehicle currently.  She states that her migraines are slightly better since her Botox injection.  She states that she tends to get a headache with changes in the barometric pressure.  She returns today for evaluation.  HISTORY 03/23/18 Michelle Reilly is a 57 year old female with a history of intractable migraine headaches.  She returns today for follow-up.  The patient did 2 injections of Aimovig but this was not beneficial for her headaches.  She would like to switch back to botox.  She states that she is having a daily headache accompanied with nausea and vomiting as well as balance changes.  She states that she has a probably 3-4 severe headaches a week.  She states that with the headaches she does have strokelike symptoms.  Reports that the left  side of face is asymmetrical and has weakness in the left arm and leg.  She states that these headaches can last  for an entire day.  She feels that she got better benefit with Botox and would like to restart this medication if possible.  She returns today for an evaluation.     REVIEW OF SYSTEMS: Out of a complete 14 system review of symptoms, the patient complains only of the following symptoms, and all other reviewed systems are negative.  Light sensitivity, blurred vision, cold intolerance, heat intolerance, insomnia, frequent waking, daytime sleepiness  ALLERGIES: Allergies  Allergen Reactions  . Other Other (See Comments)    Other reaction(s): Other Propafol (sedating agent) - causes severe congestion  . Depakote [Divalproex Sodium]     Weakness, loss of appetite, nausea, severe insomnia, loose bowels.   . Hydrocodone-Acetaminophen Hives  . Oxycodone-Acetaminophen Itching    Other Reaction: itchy face  . Penicillins Hives    .Marland KitchenHas patient had a PCN reaction causing immediate rash, facial/tongue/throat swelling, SOB or lightheadedness with hypotension: No Has patient had a PCN reaction causing severe rash involving mucus membranes or skin necrosis: No Has patient had a PCN reaction that required hospitalization No Has patient had a PCN reaction occurring within the last 10 years: No If all of the above answers are "NO", then may proceed with Cephalosporin use.   Marland Kitchen Petrolatum-Zinc Oxide Other (See  Comments)    Other reaction(s): Vomiting  . Propofol Other (See Comments)    After heart ablation, they told her she was allergic, all she remembers is congestion.  . Topamax [Topiramate] Other (See Comments)    Flu like symptoms  . Doxycycline Rash  . Erythromycin Rash  . Nitrofurantoin Nausea And Vomiting  . Sulfa Antibiotics Nausea And Vomiting    HOME MEDICATIONS: Outpatient Medications Prior to Visit  Medication Sig Dispense Refill  . acetaminophen (TYLENOL) 500 MG tablet  Take 1,000 mg by mouth every 6 (six) hours as needed for mild pain or headache.     . AMITIZA 24 MCG capsule TAKE 1 CAPSULE BY MOUTH 2 TIMES DAILY WITH MEALS FOR 30 DAYS.  3  . buPROPion (WELLBUTRIN XL) 150 MG 24 hr tablet one tablet daily in the morning    . gabapentin (NEURONTIN) 300 MG capsule Take 3 capsules (900 mg total) by mouth at bedtime. 90 capsule 1  . ketorolac (TORADOL) 10 MG tablet TAKE 1 TABLET (10 MG TOTAL) BY MOUTH EVERY 6 (SIX) HOURS AS NEEDED. 30 tablet 4  . ondansetron (ZOFRAN) 4 MG tablet TAKE 1 TABLET (4 MG TOTAL) BY MOUTH EVERY 8 (EIGHT) HOURS AS NEEDED FOR NAUSEA OR VOMITING. 30 tablet 1  . polyethylene glycol (MIRALAX / GLYCOLAX) packet Take 17 g by mouth daily as needed.     . traMADol (ULTRAM) 50 MG tablet Take 100 mg by mouth as needed for moderate pain.     . verapamil (VERELAN) 100 MG 24 hr capsule Take 100 mg by mouth at bedtime.     . Vitamin D, Ergocalciferol, (DRISDOL) 50000 UNITS CAPS capsule Take 50,000 Units by mouth once a week.    . zonisamide (ZONEGRAN) 50 MG capsule Take 3 capsules (150 mg total) by mouth 2 (two) times daily.     No facility-administered medications prior to visit.     PAST MEDICAL HISTORY: Past Medical History:  Diagnosis Date  . Classical migraine with intractable migraine 07/23/2016  . Depression   . Fibromyalgia   . Meningioma ([place])   . 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 Tachycardia    superior Ventriculat    PAST SURGICAL HISTORY: Past Surgical History:  Procedure Laterality Date  . BRAIN MENINGIOMA EXCISION  05/2008   Gamma knife procedure 2016  . ENDOMETRIAL ABLATION  8/06  . heart ablation  2/12    FAMILY HISTORY: Family History  Problem Relation Age of Onset  . Diabetes Mother   . Arthritis/Rheumatoid Mother   . Addison's disease Mother   . Anemia Mother   . Migraines Mother   . Cancer - Lung Father   . Metabolic syndrome Brother   . Migraines Brother   . Migraines Brother      SOCIAL HISTORY: Social History   Socioeconomic History  . Marital status: Married    Spouse name: Not on file  . Number of children: 2  . Years of education: college  . Highest education level: Not on file  Occupational History  . Occupation: [employer]  Social Needs  . Financial resource strain: Not on file  . Food insecurity:    Worry: Not on file    Inability: Not on file  . Transportation needs:    Medical: Not on file    Non-medical: Not on file  Tobacco Use  . Smoking status: Never Smoker  . Smokeless tobacco: Never Used  Substance and Sexual Activity  . Alcohol use: Yes  .  Drug use: No  . Sexual activity: Yes  Lifestyle  . Physical activity:    Days per week: Not on file    Minutes per session: Not on file  . Stress: Not on file  Relationships  . Social connections:    Talks on phone: Not on file    Gets together: Not on file    Attends religious service: Not on file    Active member of club or organization: Not on file    Attends meetings of clubs or organizations: Not on file    Relationship status: Not on file  . Intimate partner violence:    Fear of current or ex partner: Not on file    Emotionally abused: Not on file    Physically abused: Not on file    Forced sexual activity: Not on file  Other Topics Concern  . Not on file  Social History Narrative  . Not on file      PHYSICAL EXAM  Vitals:   05/15/18 1054  BP: 123/83  Pulse: 75  Weight: 142 lb (64.4 kg)  Height: 5\' 1"  (1.549 m)   Body mass index is 26.41 kg/m.  Generalized: Well developed, in no acute distress   Neurological examination  Mentation: Alert oriented to time, place, history taking. Follows all commands speech and language fluent Cranial nerve II-XII: Pupils were equal round reactive to light. Extraocular movements were full, visual field were full on confrontational test. Facial sensation and strength were normal. Uvula tongue midline. Head turning and shoulder shrug   were normal and symmetric. Motor: The motor testing reveals 5 over 5 strength of all 4 extremities. Good symmetric motor tone is noted throughout.  Sensory: Sensory testing is intact to soft touch on all 4 extremities. No evidence of extinction is noted.  Coordination: Cerebellar testing reveals good finger-nose-finger and heel-to-shin bilaterally.  Gait and station: Gait is normal. Tandem gait is normal. Romberg is negative. No drift is seen.  Reflexes: Deep tendon reflexes are symmetric and normal bilaterally.   DIAGNOSTIC DATA (LABS, IMAGING, TESTING) - I reviewed patient records, labs, notes, testing and imaging myself where available.  Lab Results  Component Value Date   WBC 5.6 08/31/2016   HGB 13.5 08/31/2016   HCT 40.3 08/31/2016   MCV 92.9 08/31/2016   PLT 287 08/31/2016      Component Value Date/Time   NA 145 (H) 01/25/2018 1011   K 5.2 01/25/2018 1011   CL 105 01/25/2018 1011   CO2 23 01/25/2018 1011   GLUCOSE 118 (H) 01/25/2018 1011   GLUCOSE 88 08/31/2016 0755   BUN 14 01/25/2018 1011   CREATININE 0.91 01/25/2018 1011   CALCIUM 10.1 01/25/2018 1011   PROT 6.8 01/25/2018 1011   ALBUMIN 4.5 01/25/2018 1011   AST 12 01/25/2018 1011   ALT 12 01/25/2018 1011   ALKPHOS 114 01/25/2018 1011   BILITOT <0.2 01/25/2018 1011   GFRNONAA 72 01/25/2018 1011   GFRAA 83 01/25/2018 1011     ASSESSMENT AND PLAN 57 y.o. 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 here with:  1.  Migraine headache 2.  Memory disturbance  The patient will continue on Botox therapy for her migraines.  Her memory score today is 28/30.  I will check blood work to rule out any reversible causes for her  Memory disturbance.  Have also recommended that  she repeat neuropsychological evaluation.  Her last evaluation was done in 2017.   She is amenable to this plan.  For now we will continue to monitor her symptoms.  She is advised that she follow-up in 8 months or sooner if needed.  Ward Givens, MSN, NP-C 05/15/2018, 10:57 AM [facility] [address] [place], [place] [address] [phone]

## 2018-05-15 NOTE — Progress Notes (Signed)
I have read the note, and I agree with the clinical assessment and plan.  Nyima Vanacker K Jimma Ortman   

## 2018-05-16 ENCOUNTER — Telehealth: Payer: Self-pay | Admitting: Adult Health

## 2018-05-16 LAB — TSH: TSH: 0.98 u[IU]/mL (ref 0.450–4.500)

## 2018-05-16 LAB — COMPREHENSIVE METABOLIC PANEL
A/G RATIO: 2.2 (ref 1.2–2.2)
ALT: 14 IU/L (ref 0–32)
AST: 17 IU/L (ref 0–40)
Albumin: 4.4 g/dL (ref 3.5–5.5)
Alkaline Phosphatase: 97 IU/L (ref 39–117)
BILIRUBIN TOTAL: 0.2 mg/dL (ref 0.0–1.2)
BUN/Creatinine Ratio: 16 (ref 9–23)
BUN: 13 mg/dL (ref 6–24)
CALCIUM: 9.4 mg/dL (ref 8.7–10.2)
CHLORIDE: 107 mmol/L — AB (ref 96–106)
CO2: 22 mmol/L (ref 20–29)
Creatinine, Ser: 0.83 mg/dL (ref 0.57–1.00)
GFR, EST AFRICAN AMERICAN: 92 mL/min/{1.73_m2} (ref >59)
GFR, EST NON AFRICAN AMERICAN: 80 mL/min/{1.73_m2} (ref >59)
GLOBULIN, TOTAL: 2 g/dL (ref 1.5–4.5)
Glucose: 82 mg/dL (ref 65–99)
Potassium: 4.2 mmol/L (ref 3.5–5.2)
SODIUM: 145 mmol/L — AB (ref 134–144)
TOTAL PROTEIN: 6.4 g/dL (ref 6.0–8.5)

## 2018-05-16 LAB — CBC WITH DIFFERENTIAL/PLATELET
BASOS: 1 %
Basophils Absolute: 0 10*3/uL (ref 0.0–0.2)
EOS (ABSOLUTE): 0.1 10*3/uL (ref 0.0–0.4)
Eos: 2 %
HEMATOCRIT: 40.7 % (ref 34.0–46.6)
HEMOGLOBIN: 13.7 g/dL (ref 11.1–15.9)
IMMATURE GRANULOCYTES: 0 %
Immature Grans (Abs): 0 10*3/uL (ref 0.0–0.1)
LYMPHS ABS: 2 10*3/uL (ref 0.7–3.1)
LYMPHS: 30 %
MCH: 31.4 pg (ref 26.6–33.0)
MCHC: 33.7 g/dL (ref 31.5–35.7)
MCV: 93 fL (ref 79–97)
Monocytes Absolute: 0.4 10*3/uL (ref 0.1–0.9)
Monocytes: 6 %
NEUTROS ABS: 4.1 10*3/uL (ref 1.4–7.0)
Neutrophils: 61 %
Platelets: 274 10*3/uL (ref 150–450)
RBC: 4.37 x10E6/uL (ref 3.77–5.28)
RDW: 13.1 % (ref 12.3–15.4)
WBC: 6.6 10*3/uL (ref 3.4–10.8)

## 2018-05-16 LAB — VITAMIN B12: VITAMIN B 12: 272 pg/mL (ref 232–1245)

## 2018-05-16 NOTE — Telephone Encounter (Signed)
Referral has been sent to Dr. Jonelle Sidle [facility] telephone [phone] - fax [phone]. I have called patient and gave her telephone number as well. [facility] will call patient to schedule . Patient is aware.

## 2018-05-17 ENCOUNTER — Telehealth: Payer: Self-pay | Admitting: *Deleted

## 2018-05-17 ENCOUNTER — Ambulatory Visit: Payer: Self-pay | Admitting: Adult Health

## 2018-05-17 NOTE — Telephone Encounter (Signed)
I spoke to pt and relayed that the lab results unremarkable except for low normal B12, recommended taking supplement 1081mcg daily.  She verbalized understanding.

## 2018-05-17 NOTE — Telephone Encounter (Signed)
-----   Message from Ward Givens, NP sent at 05/16/2018  3:27 PM EDT ----- Blood work unremarkable with exception of vitamin B12 is in the low normal range.  She should consider taking a multivitamin that contains B12 or she can take a vitamin B12 supplement.  I recommend 1063mcg daily

## 2018-07-03 ENCOUNTER — Encounter: Payer: Self-pay | Admitting: Neurology

## 2018-07-03 ENCOUNTER — Telehealth: Payer: Self-pay | Admitting: Adult Health

## 2018-07-03 NOTE — Telephone Encounter (Signed)
LMVM for pt that she can come in now for depacon 500mg  VI and repeat x 1.

## 2018-07-03 NOTE — Telephone Encounter (Signed)
Pt called stating she has been fighting a migraine since last Tuesday 7/30. Stating no medications have touched her migraine. Pt would like to come in for an infusion or have prednisone called in. Please call to advise

## 2018-07-03 NOTE — Telephone Encounter (Signed)
I called the patient. We will do a Depacon infusion 500 mg IV x1 can repeat 500mg  if needed.

## 2018-07-03 NOTE — Telephone Encounter (Signed)
Pt called back. She is wanting to know if she will be able to have infusion today if not she is going to the hospital. Please call to advise

## 2018-07-03 NOTE — Telephone Encounter (Signed)
Spoke to spouse, cory.  Pt asleep right now.Up and down all last night.   Pt has been with migraine since last week, 06/27/18, off last 2 days and today 3rd day.  She is level 6-7 now with nausea.  Toradol has not helped.  Asking for an infusion.  Please advise.  I believe has had one previously.

## 2018-07-13 MED ORDER — PREDNISONE 5 MG PO TABS: ORAL_TABLET | ORAL | 0 refills | Status: DC

## 2018-07-13 NOTE — Telephone Encounter (Signed)
I contacted the patient and reviewed NP's recommendation along with medication dosage. Patient voiced understanding and was agreeable to plan. She had no further questions at this time.  MB RN.

## 2018-07-13 NOTE — Addendum Note (Signed)
Addended by: Trudie Buckler on: 07/13/2018 10:39 AM   Modules accepted: Orders

## 2018-07-13 NOTE — Telephone Encounter (Signed)
The patient has had a Depakote infusion in the past that did not offer her much benefit.  We will do a prednisone dose pack.  If this is not helpful she is to call and let us know.  However if the patient's headache significantly worsens or she develops any concerning symptoms she should go to the emergency room.

## 2018-07-13 NOTE — Telephone Encounter (Signed)
Pt called stating that the infusion helped for about a week, but today the migraine has come back in full force. Pt states she had to leave work yesterday due to the pain and is out today. Would like a call to discuss an Rx for prednisone.

## 2018-07-13 NOTE — Telephone Encounter (Signed)
I contacted the patient back. She stated Tuesday evening she had a slight migraine that has gotten progressively worse. Patient stated the pain is localized to the top of her head and she has had associated nausea but denies vomiting.  Patient states she has been sensitive to light, loud noises and strong smells. Denies blurred vision but states reading makes the pain worse. Patient medicated with ketorolac 10 mg every 6 hours, zofran 4 mg every 8 and otc tylenol yesterday and this did not help. Patient stated this morning she took two 50 mg tramadol and has been using ice as needed. Patient stated she has been experiencing numbness and tingling in the bottom of her left food but denies these symptoms in other locations.  MB RN

## 2018-08-03 ENCOUNTER — Other Ambulatory Visit: Payer: Self-pay | Admitting: Adult Health

## 2018-08-10 ENCOUNTER — Encounter: Payer: Self-pay | Admitting: Adult Health

## 2018-08-10 ENCOUNTER — Telehealth: Payer: Self-pay | Admitting: Adult Health

## 2018-08-10 ENCOUNTER — Ambulatory Visit (INDEPENDENT_AMBULATORY_CARE_PROVIDER_SITE_OTHER): Payer: Managed Care, Other (non HMO) | Admitting: Adult Health

## 2018-08-10 VITALS — BP 133/82 | HR 82 | Ht 61.0 in

## 2018-08-10 DIAGNOSIS — G43719 Chronic migraine without aura, intractable, without status migrainosus: Secondary | ICD-10-CM | POA: Diagnosis not present

## 2018-08-10 NOTE — Progress Notes (Signed)
Botox 100 units x 2 vials, lot # [id number], exp date 01/2021. Each vial diluted with 2.2 mL bacteriostatic NS.

## 2018-08-10 NOTE — Telephone Encounter (Signed)
Appointment was scheduled. Will notify the patient

## 2018-08-10 NOTE — Telephone Encounter (Signed)
Patient would like December 18th at 3:30 Botox follow up. It is ok per Jinny Blossom but I can not override it. Please schedule.

## 2018-08-10 NOTE — Progress Notes (Signed)
     BOTOX PROCEDURE NOTE FOR MIGRAINE HEADACHE    Contraindications and precautions discussed with patient(above). Aseptic procedure was observed and patient tolerated procedure. Procedure performed by Ward Givens NP  The condition has existed for more than 6 months, and pt does not have a diagnosis of ALS, Myasthenia Gravis or Lambert-Eaton Syndrome.  Risks and benefits of injections discussed and pt agrees to proceed with the procedure.  Written consent obtained  These injections are medically necessary. Pt  receives good benefits from these injections. She reports that her headaches have improved. Her husband has also noticed the difference. These injections do not cause sedations or hallucinations which the oral therapies may cause. Consent obtained.   Indication/Diagnosis: chronic migraine BOTOX(J0585) injection was performed according to protocol by [employer]. 200 units of BOTOX was dissolved into 4 cc NS.    NDC: [address]-66  lot # [id number] exp date 01/2021   Description of procedure:  The patient was placed in a sitting position. The standard protocol was used for Botox as follows, with 5 units of Botox injected at each site:   -Procerus muscle, midline injection  -Corrugator muscle, bilateral injection  -Frontalis muscle, bilateral injection, with 2 sites each side, medial injection was performed in the upper one third of the frontalis muscle, in the region vertical from the medial inferior edge of the superior orbital rim. The lateral injection was again in the upper one third of the forehead vertically above the lateral limbus of the cornea, 1.5 cm lateral to the medial injection site.  -Temporalis muscle injection, 4 sites, bilaterally. The first injection was 3 cm above the tragus of the ear, second injection site was 1.5 cm to 3 cm up from the first injection site in line with the tragus of the ear. The third injection site was 1.5-3 cm forward between the first 2  injection sites. The fourth injection site was 1.5 cm posterior to the second injection site.  -Occipitalis muscle injection, 3 sites, bilaterally. The first injection was done one half way between the occipital protuberance and the tip of the mastoid process behind the ear. The second injection site was done lateral and superior to the first, 1 fingerbreadth from the first injection. The third injection site was 1 fingerbreadth superiorly and medially from the first injection site.  -Cervical paraspinal muscle injection, 2 sites, bilateral knee first injection site was 1 cm from the midline of the cervical spine, 3 cm inferior to the lower border of the occipital protuberance. The second injection site was 1.5 cm superiorly and laterally to the first injection site.  -Trapezius muscle injection was performed at 3 sites, bilaterally. The first injection site was in the upper trapezius muscle halfway between the inflection point of the neck, and the acromion. The second injection site was one half way between the acromion and the first injection site. The third injection was done between the first injection site and the inflection point of the neck.   Will return for repeat injection in 3 months.   A 200 unit sof Botox was used, 155 units were injected, the rest of the Botox was wasted. The patient tolerated the procedure well, there were no complications of the above procedure.     Ward Givens, MSN, NP-C 08/10/2018, 3:42 PM [facility] [address], [place], [place] [address] [phone]

## 2018-08-10 NOTE — Telephone Encounter (Signed)
Patient aware of appointment and time.

## 2018-08-22 ENCOUNTER — Other Ambulatory Visit: Payer: Self-pay | Admitting: Neurology

## 2018-08-27 ENCOUNTER — Other Ambulatory Visit: Payer: Self-pay | Admitting: Adult Health

## 2018-08-27 IMAGING — CT CT ABD-PELV W/ CM
2 of 5 series · 16 of 46 positions shown, 18 images · IV contrast (ISOVUE)
Comparison: None.

CLINICAL DATA: Constipation and progressive left flank pain.

EXAM:
CT ABDOMEN AND PELVIS WITH CONTRAST
TECHNIQUE: Multidetector CT imaging of the abdomen and pelvis was performed
using the standard protocol following bolus administration of
intravenous contrast.
CONTRAST:  100mL 2AV5BQ-1BB IOPAMIDOL ([id number]-1BB) INJECTION 61%

[Series 2: abd/pel with · axial · 0.75mm/px · z∈[+1160,+1500]mm · 13 of 80 slices shown, 15 images]
[im 6/80  soft-tissue]
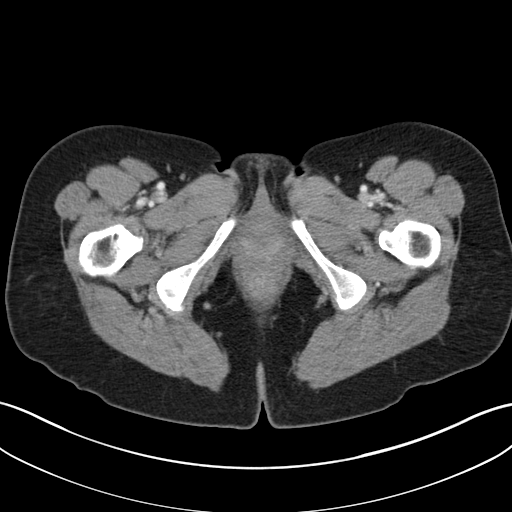
[im 6/80  bone]
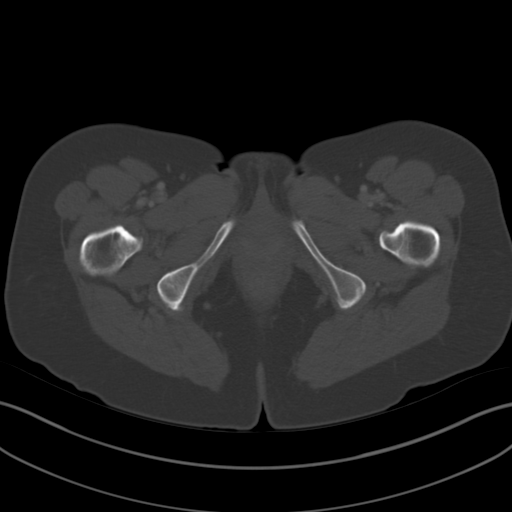
[im 12/80  soft-tissue]
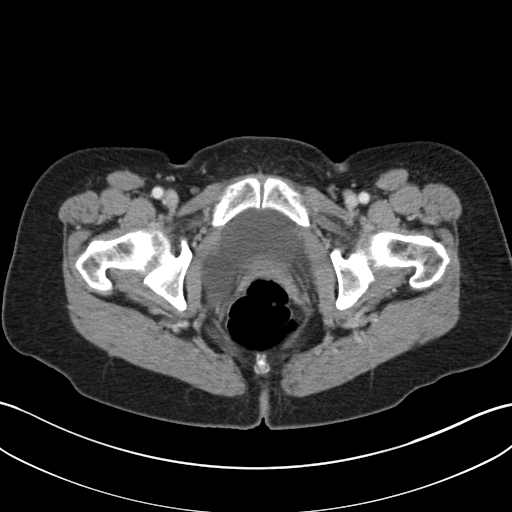
[im 17/80  soft-tissue]
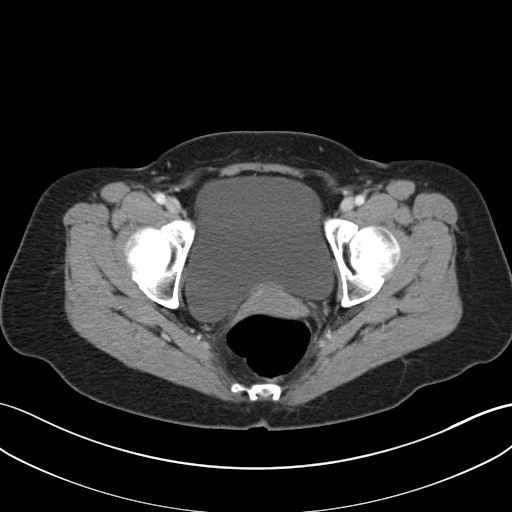
[im 23/80  soft-tissue]
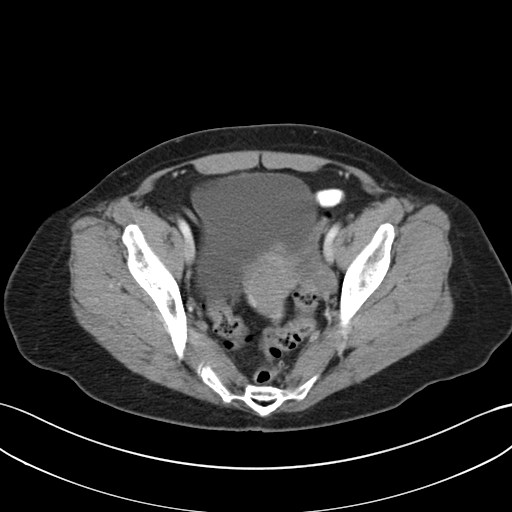
[im 29/80  soft-tissue]
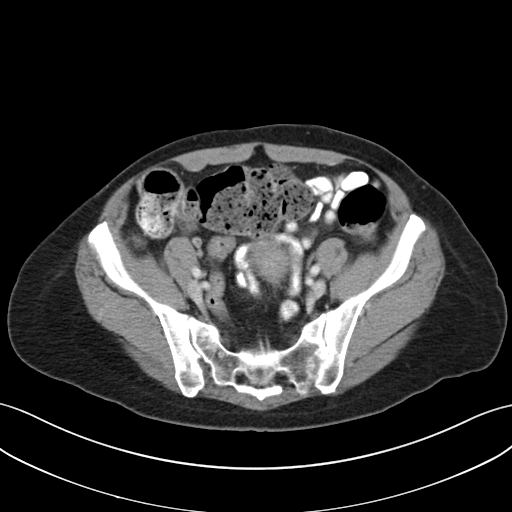
[im 34/80  soft-tissue]
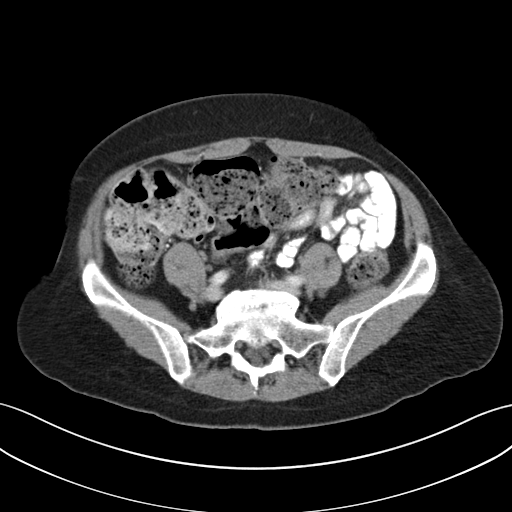
[im 40/80  soft-tissue]
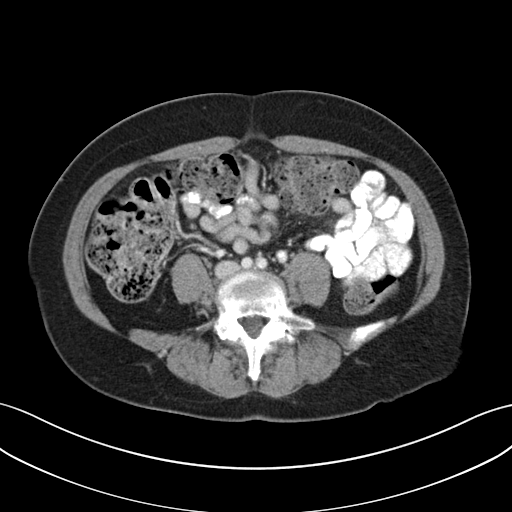
[im 46/80  soft-tissue]
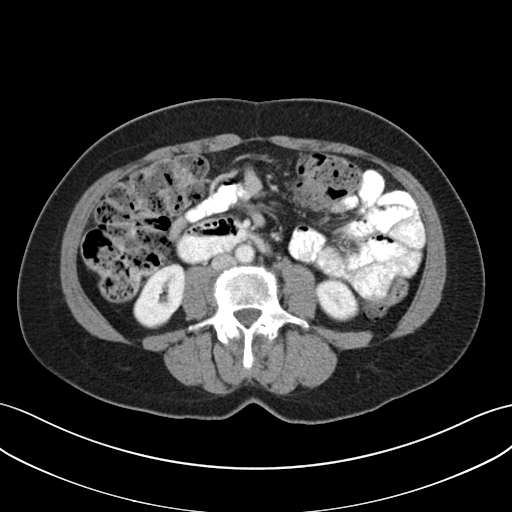
[im 51/80  soft-tissue]
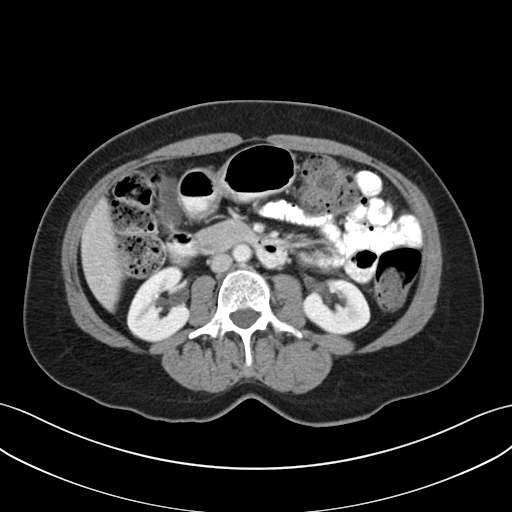
[im 51/80  bone]
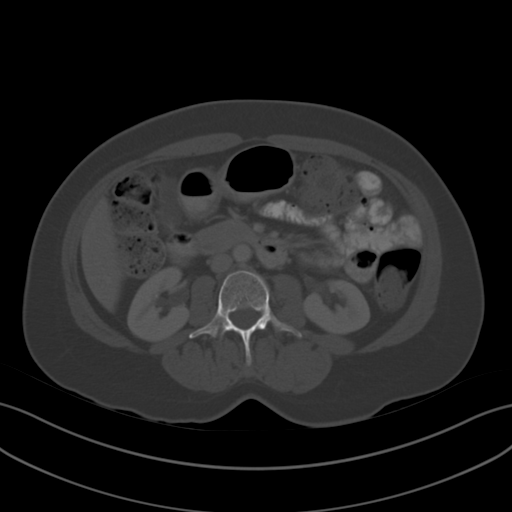
[im 57/80  soft-tissue]
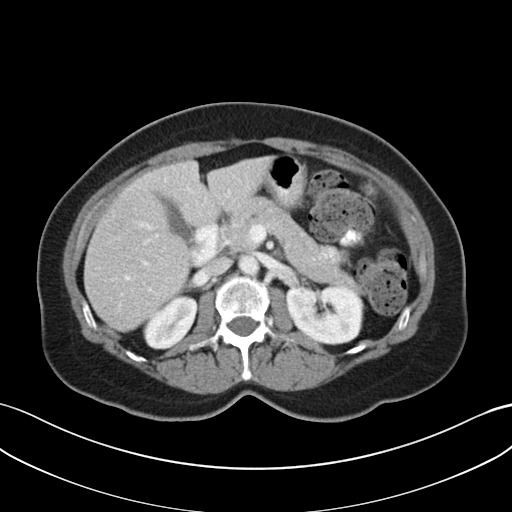
[im 63/80  soft-tissue]
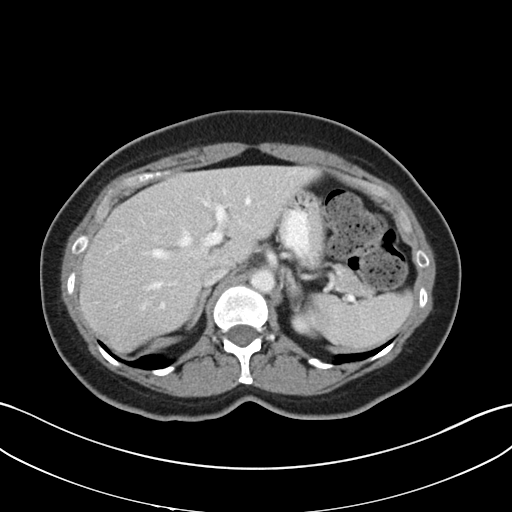
[im 68/80  soft-tissue]
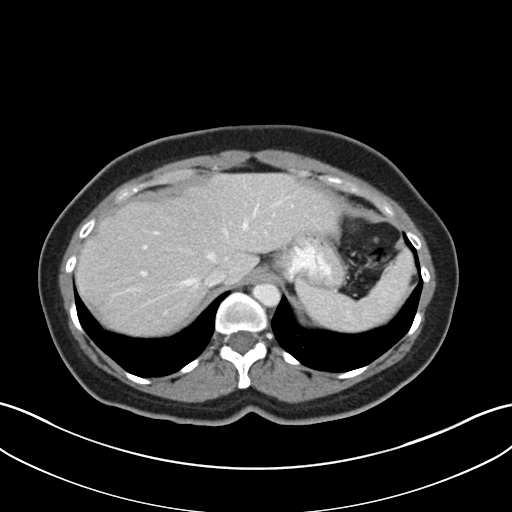
[im 74/80  soft-tissue]
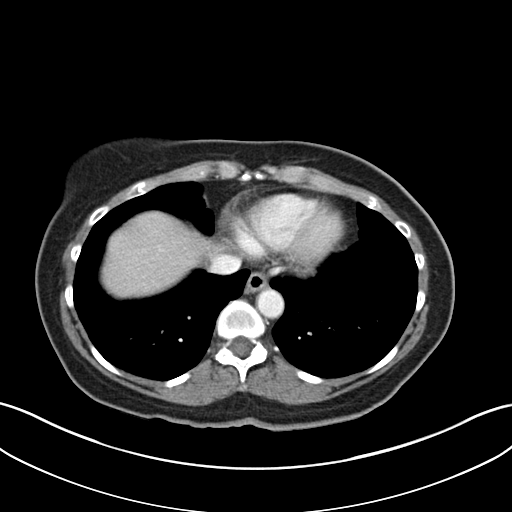

[Series 3: coronal a/|p · coronal · 0.77mm/px · 3 of 129 slices shown]
[im 43/129  soft-tissue]
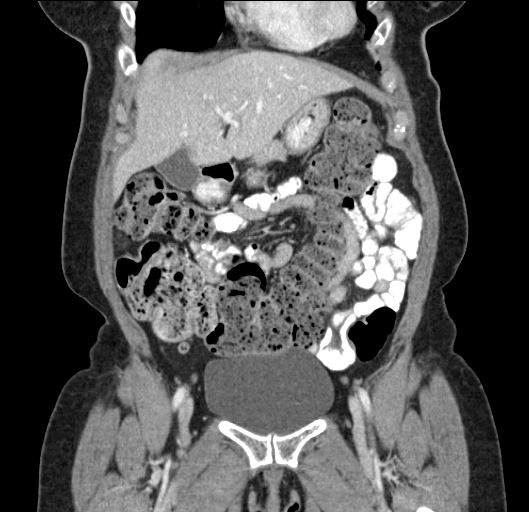
[im 57/129  soft-tissue]
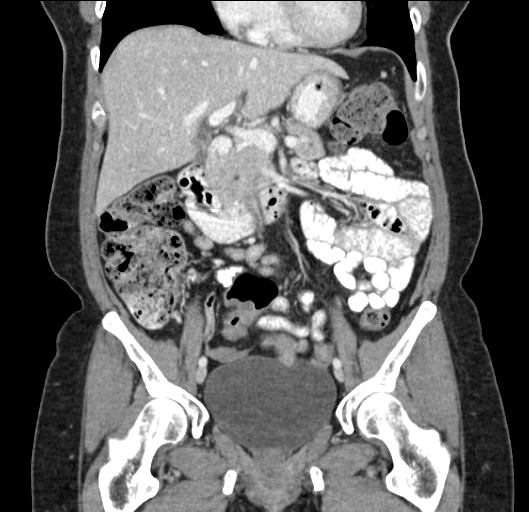
[im 72/129  soft-tissue]
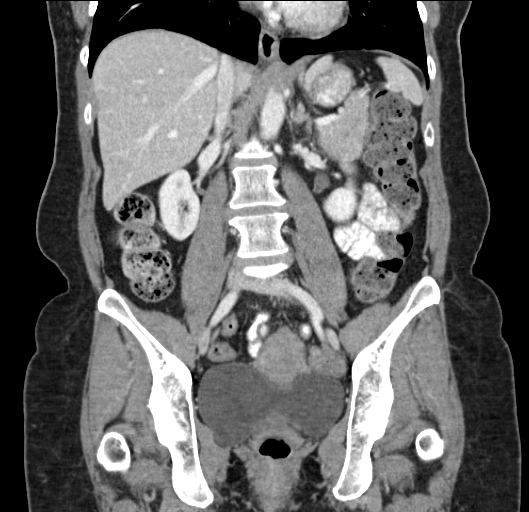

[16 of 46 positions shown; findings below may reference images not displayed]

FINDINGS: Lower chest: Unremarkable

Hepatobiliary: Unremarkable

Pancreas: Unremarkable

Spleen: Unremarkable

Adrenals/Urinary Tract: 2-3 mm right kidney lower pole
nonobstructive calculus. 2 mm left mid kidney nonobstructive
calculus. No hydronephrosis or hydroureter. No ureteral or bladder
calculus identified.

Stomach/Bowel: Prominent stool throughout the colon favors
constipation. Appendix normal. No dilated small bowel. The
prominence of stool affects primarily the proximal 75% of the colon.

Vascular/Lymphatic: Unremarkable

Reproductive: Suspected small anterior uterine body intramural
fibroid.

Other: No supplemental non-categorized findings.

Musculoskeletal: Lumbar spondylosis and degenerative disc disease at
L5-S1 without impingement. Small umbilical hernia contains adipose
tissue.
IMPRESSION: 1.  Prominent stool throughout the colon favors constipation.
2. Bilateral nonobstructive nephrolithiasis.
3. Small anterior uterine body fibroid.
4. Lumbar spondylosis and degenerative disc disease at L5-S1,
without impingement.

## 2018-09-25 ENCOUNTER — Ambulatory Visit: Payer: Managed Care, Other (non HMO) | Admitting: Adult Health

## 2018-10-12 ENCOUNTER — Telehealth: Payer: Self-pay | Admitting: Adult Health

## 2018-10-12 NOTE — Telephone Encounter (Signed)
Botox letter regarding [facility] mailed to patient °

## 2018-11-14 ENCOUNTER — Ambulatory Visit: Payer: Managed Care, Other (non HMO) | Admitting: Adult Health

## 2018-11-15 ENCOUNTER — Ambulatory Visit: Payer: Self-pay | Admitting: Adult Health

## 2018-11-21 ENCOUNTER — Other Ambulatory Visit: Payer: Self-pay | Admitting: Neurology

## 2019-02-23 ENCOUNTER — Other Ambulatory Visit: Payer: Self-pay | Admitting: Neurology

## 2019-07-09 ENCOUNTER — Other Ambulatory Visit: Payer: Self-pay | Admitting: Adult Health
